# Patient Record
Sex: Female | Born: 1945 | Hispanic: Yes | Smoking: Never smoker
Health system: Southern US, Community
[De-identification: ages and names within clinical notes are randomized; demographics above are authoritative.]

## PROBLEM LIST (undated history)

## (undated) DIAGNOSIS — C801 Malignant (primary) neoplasm, unspecified: Secondary | ICD-10-CM

## (undated) DIAGNOSIS — E785 Hyperlipidemia, unspecified: Secondary | ICD-10-CM

## (undated) DIAGNOSIS — I10 Essential (primary) hypertension: Secondary | ICD-10-CM

## (undated) DIAGNOSIS — E119 Type 2 diabetes mellitus without complications: Secondary | ICD-10-CM

## (undated) HISTORY — PX: TONSILLECTOMY: SUR1361

## (undated) HISTORY — PX: BREAST SURGERY: SHX581

---

## 2017-05-06 ENCOUNTER — Encounter (HOSPITAL_BASED_OUTPATIENT_CLINIC_OR_DEPARTMENT_OTHER): Payer: Self-pay | Admitting: *Deleted

## 2017-05-06 ENCOUNTER — Emergency Department (HOSPITAL_BASED_OUTPATIENT_CLINIC_OR_DEPARTMENT_OTHER): Payer: Medicare HMO

## 2017-05-06 ENCOUNTER — Emergency Department (HOSPITAL_BASED_OUTPATIENT_CLINIC_OR_DEPARTMENT_OTHER)
Admission: EM | Admit: 2017-05-06 | Discharge: 2017-05-06 | Disposition: A | Discharge status: Home / Self Care | Payer: Medicare HMO | Attending: Emergency Medicine | Admitting: Emergency Medicine

## 2017-05-06 DIAGNOSIS — Z7984 Long term (current) use of oral hypoglycemic drugs: Secondary | ICD-10-CM | POA: Insufficient documentation

## 2017-05-06 DIAGNOSIS — N189 Chronic kidney disease, unspecified: Secondary | ICD-10-CM | POA: Insufficient documentation

## 2017-05-06 DIAGNOSIS — I129 Hypertensive chronic kidney disease with stage 1 through stage 4 chronic kidney disease, or unspecified chronic kidney disease: Secondary | ICD-10-CM | POA: Diagnosis not present

## 2017-05-06 DIAGNOSIS — Z7982 Long term (current) use of aspirin: Secondary | ICD-10-CM | POA: Insufficient documentation

## 2017-05-06 DIAGNOSIS — E1122 Type 2 diabetes mellitus with diabetic chronic kidney disease: Secondary | ICD-10-CM | POA: Insufficient documentation

## 2017-05-06 DIAGNOSIS — Z7902 Long term (current) use of antithrombotics/antiplatelets: Secondary | ICD-10-CM | POA: Insufficient documentation

## 2017-05-06 DIAGNOSIS — Z79899 Other long term (current) drug therapy: Secondary | ICD-10-CM | POA: Insufficient documentation

## 2017-05-06 DIAGNOSIS — N281 Cyst of kidney, acquired: Secondary | ICD-10-CM | POA: Diagnosis not present

## 2017-05-06 DIAGNOSIS — M545 Low back pain, unspecified: Secondary | ICD-10-CM

## 2017-05-06 DIAGNOSIS — M549 Dorsalgia, unspecified: Secondary | ICD-10-CM

## 2017-05-06 HISTORY — DX: Essential (primary) hypertension: I10

## 2017-05-06 HISTORY — DX: Hyperlipidemia, unspecified: E78.5

## 2017-05-06 HISTORY — DX: Type 2 diabetes mellitus without complications: E11.9

## 2017-05-06 LAB — URINALYSIS, ROUTINE W REFLEX MICROSCOPIC
Bilirubin Urine: NEGATIVE
Glucose, UA: NEGATIVE mg/dL
Hgb urine dipstick: NEGATIVE
Ketones, ur: NEGATIVE mg/dL
Leukocytes, UA: NEGATIVE
Nitrite: NEGATIVE
Protein, ur: NEGATIVE mg/dL
Specific Gravity, Urine: 1.003 — ABNORMAL LOW (ref 1.005–1.030)
pH: 7 (ref 5.0–8.0)

## 2017-05-06 LAB — COMPREHENSIVE METABOLIC PANEL
ALT: 11 U/L — ABNORMAL LOW (ref 14–54)
AST: 16 U/L (ref 15–41)
Albumin: 4.6 g/dL (ref 3.5–5.0)
Alkaline Phosphatase: 61 U/L (ref 38–126)
Anion gap: 11 (ref 5–15)
BUN: 25 mg/dL — ABNORMAL HIGH (ref 6–20)
CO2: 25 mmol/L (ref 22–32)
Calcium: 10.6 mg/dL — ABNORMAL HIGH (ref 8.9–10.3)
Chloride: 100 mmol/L — ABNORMAL LOW (ref 101–111)
Creatinine, Ser: 1.16 mg/dL — ABNORMAL HIGH (ref 0.44–1.00)
GFR calc Af Amer: 53 mL/min — ABNORMAL LOW (ref >60)
GFR calc non Af Amer: 46 mL/min — ABNORMAL LOW (ref >60)
Glucose, Bld: 127 mg/dL — ABNORMAL HIGH (ref 65–99)
Potassium: 3.3 mmol/L — ABNORMAL LOW (ref 3.5–5.1)
Sodium: 136 mmol/L (ref 135–145)
Total Bilirubin: 0.8 mg/dL (ref 0.3–1.2)
Total Protein: 8 g/dL (ref 6.5–8.1)

## 2017-05-06 LAB — CBC WITH DIFFERENTIAL/PLATELET
Basophils Absolute: 0 10*3/uL (ref 0.0–0.1)
Basophils Relative: 0 %
Eosinophils Absolute: 0.2 10*3/uL (ref 0.0–0.7)
Eosinophils Relative: 2 %
HCT: 36.2 % (ref 36.0–46.0)
Hemoglobin: 12.7 g/dL (ref 12.0–15.0)
Lymphocytes Relative: 17 %
Lymphs Abs: 1.7 10*3/uL (ref 0.7–4.0)
MCH: 31.8 pg (ref 26.0–34.0)
MCHC: 35.1 g/dL (ref 30.0–36.0)
MCV: 90.7 fL (ref 78.0–100.0)
Monocytes Absolute: 0.8 10*3/uL (ref 0.1–1.0)
Monocytes Relative: 7 %
Neutro Abs: 7.6 10*3/uL (ref 1.7–7.7)
Neutrophils Relative %: 74 %
Platelets: 227 10*3/uL (ref 150–400)
RBC: 3.99 MIL/uL (ref 3.87–5.11)
RDW: 12.3 % (ref 11.5–15.5)
WBC: 10.3 10*3/uL (ref 4.0–10.5)

## 2017-05-06 LAB — TROPONIN I: Troponin I: <0.03 ng/mL (ref <0.03)

## 2017-05-06 MED ORDER — HYDROCODONE-ACETAMINOPHEN 5-325 MG PO TABS
1.0000 | ORAL_TABLET | Freq: Once | ORAL | Status: AC
  Administered 2017-05-06: 1 via ORAL
  Filled 2017-05-06: qty 1

## 2017-05-06 MED ORDER — HYDROCODONE-ACETAMINOPHEN 5-325 MG PO TABS: 1.0000 | ORAL_TABLET | Freq: Four times a day (QID) | ORAL | 0 refills | Status: DC | PRN

## 2017-05-06 NOTE — ED Notes (Signed)
EDP at bedside  

## 2017-05-06 NOTE — ED Triage Notes (Signed)
Pt c/o hypertension x 2 days

## 2017-05-06 NOTE — Discharge Instructions (Signed)
You had a CT scan showed cysts on your kidneys as well as a small cyst on your right ovary.  These will need further evaluation by your doctor.  Get rechecked immediately if you develop new or worrisome symptoms.

## 2017-05-06 NOTE — ED Provider Notes (Signed)
[facility] Provider Note   CSN: [id number] Arrival date & time: 05/06/17  1203     History   Chief Complaint Chief Complaint  Patient presents with  . Hypertension    HPI Veronica Jimenez is a 71 y.o. female.  The history is provided by the patient. No language interpreter was used.    Veronica Jimenez is a 71 y.o. female who presents to the Emergency Department complaining of back pain. She reports 2 weeks of gradual onset and progressive back pain. It is in her mid lumbar region and radiates to both sides into the top of both hips. Her pain is slightly greater on the right compared to the left. She denies any fevers, abdominal pain, nausea, vomiting, diarrhea, dysuria, numbness, weakness. She does endorse some mild abdominal bloating. She has a history of CK D and hypertension. She notes that her blood pressure fluctuates significantly. Her blood pressure goes higher when she has significant pain. No recent injuries.   Labs from [place] on 05/03/17 Cr 1.6; BUN27; Na 136 k+4.5; Cl 97, co2 20, Ca 10.9, AST 14, ALT 9. Past Medical History:  Diagnosis Date  . Diabetes mellitus without complication ([place])   . Hyperlipemia   . Hypertension     There are no active problems to display for this patient.   Past Surgical History:  Procedure Laterality Date  . TONSILLECTOMY      OB History    No data available       Home Medications    Prior to Admission medications   Medication Sig Start Date End Date Taking? Authorizing Provider  amLODipine-benazepril (LOTREL) 10-40 MG capsule Take 1 capsule by mouth daily.   Yes [provider]  aspirin EC 81 MG tablet Take 81 mg by mouth daily.   Yes [provider]  cloNIDine (CATAPRES) 0.1 MG tablet Take 0.1 mg by mouth 2 (two) times daily.   Yes [provider]  hydrochlorothiazide (HYDRODIURIL) 25 MG tablet Take 25 mg by mouth daily.   Yes [provider]  metFORMIN (GLUCOPHAGE)  1000 MG tablet Take 500 mg by mouth 2 (two) times daily with a meal.   Yes [provider]  pravastatin (PRAVACHOL) 80 MG tablet Take 80 mg by mouth daily.   Yes [provider]  sotalol (BETAPACE) 80 MG tablet Take 80 mg by mouth 2 (two) times daily.   Yes [provider]  traMADol (ULTRAM) 50 MG tablet Take by mouth every 6 (six) hours as needed.   Yes [provider]  HYDROcodone-acetaminophen (NORCO/VICODIN) 5-325 MG tablet Take 1 tablet by mouth every 6 (six) hours as needed. 05/06/17   Quintella Reichert, MD    Family History No family history on file.  Social History Social History  Substance Use Topics  . Smoking status: Never Smoker  . Smokeless tobacco: Not on file  . Alcohol use No     Allergies   Shellfish allergy   Review of Systems Review of Systems  All other systems reviewed and are negative.    Physical Exam Updated Vital Signs BP 134/66 (BP Location: Right Arm)   Pulse 60   Temp 98.2 F (36.8 C) (Oral)   Resp 18   Ht 5\' 5"  (1.651 m)   Wt 66.2 kg (146 lb)   SpO2 94%   BMI 24.30 kg/m   Physical Exam  Constitutional: She is oriented to person, place, and time. She appears well-developed and well-nourished.  HENT:  Head: Normocephalic and  atraumatic.  Cardiovascular: Normal rate and regular rhythm.   No murmur heard. Pulmonary/Chest: Effort normal and breath sounds normal. No respiratory distress.  Abdominal: Soft. There is no rebound and no guarding.  Mild lower abdominal tenderness  Musculoskeletal: She exhibits no edema or tenderness.  2+ DP pulses bilaterally  Neurological: She is alert and oriented to person, place, and time.  5/5 strength in all four extremities with sensation to light touch intact in all four extremities.   Skin: Skin is warm and dry.  Psychiatric: She has a normal mood and affect. Her behavior is normal.  Nursing note and vitals reviewed.    ED Treatments / Results  Labs (all labs  ordered are listed, but only abnormal results are displayed) Labs Reviewed  COMPREHENSIVE METABOLIC PANEL - Abnormal; Notable for the following:       Result Value   Potassium 3.3 (*)    Chloride 100 (*)    Glucose, Bld 127 (*)    BUN 25 (*)    Creatinine, Ser 1.16 (*)    Calcium 10.6 (*)    ALT 11 (*)    GFR calc non Af Amer 46 (*)    GFR calc Af Amer 53 (*)    All other components within normal limits  URINALYSIS, ROUTINE W REFLEX MICROSCOPIC - Abnormal; Notable for the following:    Specific Gravity, Urine 1.003 (*)    All other components within normal limits  TROPONIN I  CBC WITH DIFFERENTIAL/PLATELET    EKG  EKG Interpretation  Date/Time:  Saturday May 06 2017 14:26:10 EDT Ventricular Rate:  68 PR Interval:    QRS Duration: 94 QT Interval:  443 QTC Calculation: 472 R Axis:   70 Text Interpretation:  Sinus rhythm Multiple premature complexes, vent & supraven Baseline wander in lead(s) I III aVL V2 V3 V5 no prior available for comparison Confirmed by Hazle Coca [phone]) on 05/06/2017 2:32:54 PM       Radiology Ct Renal Stone Study  Result Date: 05/06/2017 CLINICAL DATA:  Left-sided abdominal pain for 2 weeks. EXAM: CT ABDOMEN AND PELVIS WITHOUT CONTRAST TECHNIQUE: Multidetector CT imaging of the abdomen and pelvis was performed following the standard protocol without IV contrast. COMPARISON:  None. FINDINGS: Lower chest: Bronchiectasis and partially visualized peribronchial thickening in the right middle lobe and lingula. Scattered tree-in-bud opacities with subpleural predominance. Hepatobiliary: No focal liver abnormality is seen. No gallstones, gallbladder wall thickening, or biliary dilatation. Pancreas: Unremarkable. No pancreatic ductal dilatation or surrounding inflammatory changes. Spleen: No splenic injury or perisplenic hematoma. Adrenals/Urinary Tract: Adrenal glands are normal. Bilateral tiny nonobstructive calculi, the largest of which measures 2 mm in the  midpole of the right kidney. No evidence of hydronephrosis. 4.4 cm simple appearing lower pole right renal cyst. 0.5 cm left superior pole renal cyst. Incompletely characterized due to lack of IV contrast hypoattenuated areas in the medial mid cortex of the left kidney, and bilateral upper poles. Stomach/Bowel: Stomach is within normal limits. No evidence of bowel wall thickening, distention, or inflammatory changes. Vascular/Lymphatic: Aortic atherosclerosis. No enlarged abdominal or pelvic lymph nodes. Reproductive: Uterus and left adnexa are unremarkable. Somewhat thickened appearance of the right ovary containing 1.4 cm rim calcified cystic structure. Other: No abdominal wall hernia or abnormality. No abdominopelvic ascites. Musculoskeletal: Multilevel osteoarthritic changes. IMPRESSION: No evidence of obstructive uropathy. Bilateral nephrolithiasis. Bilateral renal cysts. Additional areas of hypoattenuation within the cortex of bilateral kidneys, incompletely evaluated due to lack of IV contrast. Further evaluation with MRI or CT,  renal protocol, may be considered, if clinically desired. Somewhat prominent appearance of the right ovary, which contains rim calcified 1.4 cm cystic structure. Further evaluation with MRI or ultrasound of the pelvis may be considered, if found clinically important. Calcific atherosclerotic disease of the aorta without evidence of aneurysmal dilation. Electronically Signed   By: Fidela Salisbury M.D.   On: 05/06/2017 15:30    Procedures Procedures (including critical care time)  Medications Ordered in ED Medications  HYDROcodone-acetaminophen (NORCO/VICODIN) 5-325 MG per tablet 1 tablet (1 tablet Oral Given 05/06/17 1444)     Initial Impression / Assessment and Plan / ED Course  I have reviewed the triage vital signs and the nursing notes.  Pertinent labs & imaging results that were available during my care of the patient were reviewed by me and considered in my  medical decision making (see chart for details).     Patient here for evaluation of progressive low back pain. She has no radicular symptoms and she is neurovascularly intact on examination. Imaging is negative for obstructive stone or AAA. She does incidentally have renal cysts and a small ovarian cyst. Discussed with patient importance of PCP follow-up for further imaging to further evaluate axis. No evidence of acute UTI. Her renal function is at her baseline. Counseled patient on home care for low back pain, outpatient follow-up and return precautions.  Final Clinical Impressions(s) / ED Diagnoses   Final diagnoses:  Acute bilateral low back pain without sciatica  Renal cyst    New Prescriptions Discharge Medication List as of 05/06/2017  4:15 PM    START taking these medications   Details  HYDROcodone-acetaminophen (NORCO/VICODIN) 5-325 MG tablet Take 1 tablet by mouth every 6 (six) hours as needed., Starting Sat 05/06/2017, Print         Quintella Reichert, MD 05/07/17 0710

## 2017-05-06 NOTE — ED Notes (Signed)
Patient reports last eating yesterday, she states that she noticed she was bloated after eating, she would take omeprazole which has helped in the past, but the past couple days it is not helping. Patient states she has not eaten she feels a little better, denies hunger at this time.

## 2017-05-06 NOTE — ED Notes (Signed)
Patient c/o left abd pain x2 weeks. Patient was seen by PCP this week for blood work related to BP, also had an echo done on her heart, was told this was normal. Patient was told her kidneys were showing degenerative changes. A&Ox4.

## 2019-03-16 ENCOUNTER — Emergency Department (HOSPITAL_COMMUNITY): Payer: Medicare HMO

## 2019-03-16 ENCOUNTER — Encounter (HOSPITAL_COMMUNITY): Payer: Self-pay | Admitting: Emergency Medicine

## 2019-03-16 ENCOUNTER — Emergency Department (HOSPITAL_COMMUNITY)
Admission: EM | Admit: 2019-03-16 | Discharge: 2019-03-16 | Disposition: A | Discharge status: Home / Self Care | Payer: Medicare HMO | Attending: Emergency Medicine | Admitting: Emergency Medicine

## 2019-03-16 ENCOUNTER — Other Ambulatory Visit: Payer: Self-pay

## 2019-03-16 DIAGNOSIS — R079 Chest pain, unspecified: Secondary | ICD-10-CM

## 2019-03-16 DIAGNOSIS — E119 Type 2 diabetes mellitus without complications: Secondary | ICD-10-CM | POA: Diagnosis not present

## 2019-03-16 DIAGNOSIS — I10 Essential (primary) hypertension: Secondary | ICD-10-CM | POA: Diagnosis not present

## 2019-03-16 DIAGNOSIS — R0789 Other chest pain: Secondary | ICD-10-CM | POA: Insufficient documentation

## 2019-03-16 DIAGNOSIS — Z79899 Other long term (current) drug therapy: Secondary | ICD-10-CM | POA: Insufficient documentation

## 2019-03-16 LAB — BASIC METABOLIC PANEL
Anion gap: 11 (ref 5–15)
BUN: 38 mg/dL — ABNORMAL HIGH (ref 8–23)
CO2: 21 mmol/L — ABNORMAL LOW (ref 22–32)
Calcium: 10.3 mg/dL (ref 8.9–10.3)
Chloride: 106 mmol/L (ref 98–111)
Creatinine, Ser: 1.59 mg/dL — ABNORMAL HIGH (ref 0.44–1.00)
GFR calc Af Amer: 37 mL/min — ABNORMAL LOW (ref >60)
GFR calc non Af Amer: 32 mL/min — ABNORMAL LOW (ref >60)
Glucose, Bld: 134 mg/dL — ABNORMAL HIGH (ref 70–99)
Potassium: 3.8 mmol/L (ref 3.5–5.1)
Sodium: 138 mmol/L (ref 135–145)

## 2019-03-16 LAB — CBC
HCT: 36.5 % (ref 36.0–46.0)
Hemoglobin: 12.1 g/dL (ref 12.0–15.0)
MCH: 31 pg (ref 26.0–34.0)
MCHC: 33.2 g/dL (ref 30.0–36.0)
MCV: 93.6 fL (ref 80.0–100.0)
Platelets: 203 10*3/uL (ref 150–400)
RBC: 3.9 MIL/uL (ref 3.87–5.11)
RDW: 13.4 % (ref 11.5–15.5)
WBC: 11.3 10*3/uL — ABNORMAL HIGH (ref 4.0–10.5)
nRBC: 0 % (ref 0.0–0.2)

## 2019-03-16 LAB — TROPONIN I
Troponin I: <0.03 ng/mL (ref <0.03)
Troponin I: <0.03 ng/mL (ref <0.03)

## 2019-03-16 MED ORDER — SODIUM CHLORIDE 0.9% FLUSH
3.0000 mL | Freq: Once | INTRAVENOUS | Status: AC
  Administered 2019-03-16: 3 mL via INTRAVENOUS

## 2019-03-16 NOTE — ED Notes (Signed)
Patient transported to X-ray 

## 2019-03-16 NOTE — ED Triage Notes (Signed)
Pt BIB EMS from home, c/o central CP that radiates to central back. Denies SOB, N/V/D, lightheadedness. No hx of similar. Given 324mg  ASA by EMS.

## 2019-03-16 NOTE — ED Provider Notes (Signed)
[facility] EMERGENCY DEPARTMENT Provider Note   CSN: [id number] Arrival date & time: 03/16/19  0128    History   Chief Complaint Chief Complaint  Patient presents with  . Chest Pain    HPI Veronica Jimenez is a 73 y.o. female.  HPI: A 73 year old patient with a history of treated diabetes, hypertension and hypercholesterolemia presents for evaluation of chest pain. Initial onset of pain was less than one hour ago. The patient's chest pain is described as heaviness/pressure/tightness and is not worse with exertion. The patient's chest pain is not middle- or left-sided, is not well-localized, is not sharp and does not radiate to the arms/jaw/neck. The patient does not complain of nausea and denies diaphoresis. The patient has no history of stroke, has no history of peripheral artery disease, has not smoked in the past 90 days, has no relevant family history of coronary artery disease (first degree relative at less than age 12) and does not have an elevated BMI (>=30).   Patient comes to the ER for evaluation of chest pain.  She started to feel discomfort in her chest around 10:30 PM.  She took her blood pressure and it was markedly elevated, 190/140.  She reports that her blood pressure has been running high the last few days.  She has clonidine that she uses as needed elevated blood pressure, she took one tonight and called EMS.  Pain lasted about an hour and has now resolved.  No associated shortness of breath.  No history of heart disease but does have multiple cardiac risk factors.  Patient reports that she went to the doctor earlier today because her blood pressure has been running high and had a new blood pressure medication prescribed, but has not started it yet.  She was supposed to start it tomorrow.     Past Medical History:  Diagnosis Date  . Diabetes mellitus without complication ([place])   . Hyperlipemia   . Hypertension     There are no active problems to  display for this patient.   Past Surgical History:  Procedure Laterality Date  . TONSILLECTOMY       OB History   No obstetric history on file.      Home Medications    Prior to Admission medications   Medication Sig Start Date End Date Taking? Authorizing Provider  albuterol (VENTOLIN HFA) 108 (90 Base) MCG/ACT inhaler Inhale 1-2 puffs into the lungs every 6 (six) hours as needed for wheezing or shortness of breath.   Yes [provider]  amLODipine-benazepril (LOTREL) 10-40 MG capsule Take 1 capsule by mouth daily.   Yes [provider]  aspirin EC 81 MG tablet Take 81 mg by mouth daily.   Yes [provider]  cloNIDine (CATAPRES) 0.1 MG tablet Take 0.1 mg by mouth 2 (two) times daily as needed (high blood pressure).    Yes [provider]  linagliptin (TRADJENTA) 5 MG TABS tablet Take 5 mg by mouth daily. 01/12/18  Yes [provider]  montelukast (SINGULAIR) 10 MG tablet Take 10 mg by mouth at bedtime.   Yes [provider]  pravastatin (PRAVACHOL) 80 MG tablet Take 80 mg by mouth daily.   Yes [provider]  sotalol (BETAPACE) 80 MG tablet Take 80 mg by mouth 2 (two) times daily.   Yes [provider]  HYDROcodone-acetaminophen (NORCO/VICODIN) 5-325 MG tablet Take 1 tablet by mouth every 6 (six) hours as needed. Patient not taking: Reported on 03/16/2019 05/06/17  Quintella Reichert, MD    Family History History reviewed. No pertinent family history.  Social History Social History   Tobacco Use  . Smoking status: Never Smoker  . Smokeless tobacco: Never Used  Substance Use Topics  . Alcohol use: No  . Drug use: No     Allergies   Shellfish allergy   Review of Systems Review of Systems  Cardiovascular: Positive for chest pain.  All other systems reviewed and are negative.    Physical Exam Updated Vital Signs BP (!) 145/86   Pulse 62   Temp 98.2 F (36.8 C) (Oral)   Resp 20   Ht 5\' 4"   (1.626 m)   Wt 63.5 kg   SpO2 94%   BMI 24.03 kg/m   Physical Exam Vitals signs and nursing note reviewed.  Constitutional:      General: She is not in acute distress.    Appearance: Normal appearance. She is well-developed.  HENT:     Head: Normocephalic and atraumatic.     Right Ear: Hearing normal.     Left Ear: Hearing normal.     Nose: Nose normal.  Eyes:     Conjunctiva/sclera: Conjunctivae normal.     Pupils: Pupils are equal, round, and reactive to light.  Neck:     Musculoskeletal: Normal range of motion and neck supple.  Cardiovascular:     Rate and Rhythm: Regular rhythm.     Heart sounds: S1 normal and S2 normal. No murmur. No friction rub. No gallop.   Pulmonary:     Effort: Pulmonary effort is normal. No respiratory distress.     Breath sounds: Normal breath sounds.  Chest:     Chest wall: No tenderness.  Abdominal:     General: Bowel sounds are normal.     Palpations: Abdomen is soft.     Tenderness: There is no abdominal tenderness. There is no guarding or rebound. Negative signs include Murphy's sign and McBurney's sign.     Hernia: No hernia is present.  Musculoskeletal: Normal range of motion.  Skin:    General: Skin is warm and dry.     Findings: No rash.  Neurological:     Mental Status: She is alert and oriented to person, place, and time.     GCS: GCS eye subscore is 4. GCS verbal subscore is 5. GCS motor subscore is 6.     Cranial Nerves: No cranial nerve deficit.     Sensory: No sensory deficit.     Coordination: Coordination normal.  Psychiatric:        Speech: Speech normal.        Behavior: Behavior normal.        Thought Content: Thought content normal.      ED Treatments / Results  Labs (all labs ordered are listed, but only abnormal results are displayed) Labs Reviewed  BASIC METABOLIC PANEL - Abnormal; Notable for the following components:      Result Value   CO2 21 (*)    Glucose, Bld 134 (*)    BUN 38 (*)    Creatinine,  Ser 1.59 (*)    GFR calc non Af Amer 32 (*)    GFR calc Af Amer 37 (*)    All other components within normal limits  CBC - Abnormal; Notable for the following components:   WBC 11.3 (*)    All other components within normal limits  TROPONIN I  TROPONIN I    EKG EKG Interpretation  Date/Time:  Saturday March 16 2019 01:35:22 EDT Ventricular Rate:  72 PR Interval:    QRS Duration: 83 QT Interval:  422 QTC Calculation: 462 R Axis:   42 Text Interpretation:  Sinus rhythm Borderline T abnormalities, anterior leads No significant change since last tracing Confirmed by Orpah Greek [phone]) on 03/16/2019 2:05:25 AM   Radiology Dg Chest 2 View  Result Date: 03/16/2019 CLINICAL DATA:  Chest pain EXAM: CHEST - 2 VIEW COMPARISON:  None. FINDINGS: The heart size and mediastinal contours are within normal limits. Both lungs are clear. The visualized skeletal structures are unremarkable. IMPRESSION: No active cardiopulmonary disease. Electronically Signed   By: Ulyses Jarred M.D.   On: 03/16/2019 02:07    Procedures Procedures (including critical care time)  Medications Ordered in ED Medications  sodium chloride flush (NS) 0.9 % injection 3 mL (3 mLs Intravenous Given 03/16/19 0145)     Initial Impression / Assessment and Plan / ED Course  I have reviewed the triage vital signs and the nursing notes.  Pertinent labs & imaging results that were available during my care of the patient were reviewed by me and considered in my medical decision making (see chart for details).     HEAR Score: 5  Presents to the emergency department for evaluation of chest pain.  Patient had onset of pain approximately an hour before coming to the ER.  She took her blood pressure and it was very elevated.  She has noticed that she has been experiencing uncontrolled blood pressure over the last few days and actually saw her primary care doctor earlier today.  He prescribed a new blood pressure  medicine which she has not started yet.  She does have clonidine prescribed that she uses for blood pressure elevation as needed.  She took 1 prior to coming to the ER and her blood pressure is much improved and her pain has resolved.  EKG does not suggest ischemia or infarct.  Opponent was negative.  Patient has a heart score of 5.  I discussed this with her and recommended admission.  Additionally, however, we did discuss the risks of admission at this time during coronavirus pandemic.  She does not wish to be admitted at this time, understands the risks of missing cardiac chest pain and the possibility of MI.  I do, however, feel that she is likely experiencing noncardiac chest pain and there is a significant risk for her with her comorbidities if she were to get sick with coronavirus.  Based on this, she therefore was held in the ER for a second troponin.  This was also undetectable.  She will be discharged, referred to cardiology.  Return for any recurrent symptoms.  She will start her new blood pressure medication today, use clonidine as needed and return for uncontrolled blood pressure.  Final Clinical Impressions(s) / ED Diagnoses   Final diagnoses:  Chest pain, unspecified type  Essential hypertension    ED Discharge Orders    None       Orpah Greek, MD 03/16/19 [phone]

## 2019-09-24 ENCOUNTER — Ambulatory Visit (INDEPENDENT_AMBULATORY_CARE_PROVIDER_SITE_OTHER): Payer: Self-pay | Admitting: Licensed Clinical Social Worker

## 2019-09-24 ENCOUNTER — Other Ambulatory Visit: Payer: Self-pay

## 2019-09-24 DIAGNOSIS — F489 Nonpsychotic mental disorder, unspecified: Secondary | ICD-10-CM

## 2019-09-24 NOTE — Progress Notes (Signed)
No show

## 2019-10-28 ENCOUNTER — Ambulatory Visit (INDEPENDENT_AMBULATORY_CARE_PROVIDER_SITE_OTHER): Payer: Commercial Indemnity | Admitting: Licensed Clinical Social Worker

## 2019-10-28 DIAGNOSIS — F4323 Adjustment disorder with mixed anxiety and depressed mood: Secondary | ICD-10-CM | POA: Diagnosis not present
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
## 2019-12-09 ENCOUNTER — Ambulatory Visit (HOSPITAL_COMMUNITY): Payer: Commercial Indemnity | Admitting: Licensed Clinical Social Worker

## 2019-12-09 ENCOUNTER — Other Ambulatory Visit: Payer: Self-pay

## 2019-12-09 DIAGNOSIS — F4323 Adjustment disorder with mixed anxiety and depressed mood: Secondary | ICD-10-CM

## 2019-12-09 NOTE — Progress Notes (Signed)
THERAPIST PROGRESS NOTE  Session Time: 11:02 AM to 11:57 AM  Participation Level: Active  Behavioral Response: CasualAlertDepressed and tearful  Type of Therapy: Individual Therapy  Treatment Goals addressed:  decrease depression and anxiety, address conflicts in her relationship with children  Interventions: Solution Focused, Strength-based, Supportive and Other: coping  Summary: Arrow Emmerich is a 74 y.o. female who presents with little depressed once in awhile but feeling better. Daughter just had her baby and won't be able to see baby for a few months. Discussed whether daughter's reaction to not letting patient see the baby was extreme reaction to the pandemic. Patient shares she understands as in the past she had miscarriages and only until daughter was 47 did she start to have children. Understands her reason for being careful. At son's and just like her daughter son can be insensitive and say something mean that can hurt her feeling. Patient holds back so doesn't make it worse, so keeps quiet. It hurts. Shared she carried the load for the whole family for all those years with a husband that was sick and taken care of young children. "I worked hard for them did everything I could. Still try to help them. I don't know maybe the pandemic brings the worst out on people." Shares that daughter's comment was that patient was difficult. Patient shares her experience of living with daughter, having to do things to work around them. Daughter brings up past of patient was difficult. She brings up for example patient taking her time to get rid of things in her house, patient describes the challenge of getting rid so much stuff that was brought with her that had accumulated over the years."It has been difficult." Daughter makes faces when talking and all that got to patient. Patient describes the experience of always living in her room. Wants to help her out, daughter doesn't let her help out.  Reviewed what patient wants and she explains that she would like to be able to spend time with grandchildren both her daughter and son without being told what to do. "They make me feel like I am stupid that I don't know what I am doing, that I am not being careful".reviewed session and what was most helpful patient said she is able to talk and does not have that.   Suicidal/Homicidal: No  Therapist Response: Therapist reviewed symptoms, discussed stressors, facilitated expression of thoughts and feelings.  Reviewed communication strategies to help patient's kids understand insensitive words and how they impact her.  Explored their motivations and intentions in the relationship to help patient with coping.  For example, daughter stressor related to having miscarriages before explain stress may explain why she may be harsher than normal.  Exploring underlying reason for both of her kids being and insensitive relates also to the personality.  Reviewed patient losing independence and giving up her own place puts her in a position of dependence and not having as much control she did when she had her own place.  Reviewed ways patient can carve out more independence for herself.  Discussed strategies of communication where patient and daughter can come to agreement and realizing her participating more will be beneficial for both daughter and patient.  Also working on increasing insight of children in general in the relationship to their parents can involve lack of appreciation for what they have done, lack of recognizing wisdom parents have from experiencing a lot of life and this related to children's lack of insight, lack of growth  and self-awareness to recognize value of role of parent to the full extent.  Assess patient's expressions of feelings therapeutic for her as therapy provides an outlet that she does not have other places to express feelings.  Provided strength based on supportive intervention  Plan:  Return again in 2 weeks.2.  Work with patient on conflicts in her relationships with children, decrease depression, coping  Diagnosis: Axis I:  adjustment disorder with mixed anxiety and depression    Axis II: No diagnosis    Cordella Register, LCSW 12/09/2019

## 2019-12-23 ENCOUNTER — Ambulatory Visit (HOSPITAL_COMMUNITY): Payer: Commercial Indemnity | Admitting: Licensed Clinical Social Worker

## 2019-12-23 DIAGNOSIS — F4323 Adjustment disorder with mixed anxiety and depressed mood: Secondary | ICD-10-CM
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
## 2020-01-06 ENCOUNTER — Ambulatory Visit (INDEPENDENT_AMBULATORY_CARE_PROVIDER_SITE_OTHER): Payer: Medicare HMO | Admitting: Licensed Clinical Social Worker

## 2020-01-06 DIAGNOSIS — F4323 Adjustment disorder with mixed anxiety and depressed mood: Secondary | ICD-10-CM

## 2020-01-06 NOTE — Progress Notes (Signed)
THERAPIST PROGRESS NOTE  Session Time: 11:02 AM to 11:55 AM  Participation Level: Active  Behavioral Response: CasualAlertEuthymic and Irritable  Type of Therapy: Individual Therapy  Treatment Goals addressed: work with stress of COVID, address conflicts in her relationship with children, mood enhancment  Interventions: Solution Focused, Strength-based, Supportive and Other: coping, stress management, work on conflict in her relationships  Summary: Veronica Jimenez is a 74 y.o. female who presents with discussed how things have been in her relationship with daughter.  She was able to see young grandchild 58 who is newborn and Veronica Jimenez who she had special attachment with. (Daughters' kids) reviewed little things will annoy her about her daughter for example her excessive concern about her son and feeling she is more at risk when she goes to their house.  Things that do not make sense to patient when patient is careful and son has minimal exposure.  Explored what patient feels will happen in her relationship now with patient living at son's moving out of daughters and she is not sure.  Does feel Covid has significant impact and makes things more stressful and once that is better hopes things will improve.  While at her son's, he has been great and made things easier for patient.  She sees ways he has been thoughtful, therapist pointed out positive development as she is gotten closer with his kids by staying there.  Reviewed any changes she thinks daughter could make, since daughter is gone to therapy she feels she has to let out her feelings at least when patient was living there and therapist and patient agreed they have to respect people's feelings and say things in a way that won't hurt somebody.  Now that they are not in the same house patient not sure it is the same situation.  Will feel bad if things change from what they are now as she is getting closer with youngest child of son.   Focused on central issues to conflict that daughter wants to control everything has made it worse with Covid and patient shares she is used to making her own decisions.  Daughter and son-in-law in managing decisions she feels want to put too much control on situations and at the same time not recognizing patient is taking precautions, not needing the excessive control and how she is managing things.  Suicidal/Homicidal: No  Therapist Response: Therapist reviewed symptoms, discussed stressors, facilitated expression of thoughts and feelings, completed treatment plan.  Continue to work with patient on gaining insight to relationship with daughter that will help work through conflicts and help to improve relationship.  Identify Covid as significant stressor that has put strain on relationship and when the situation improves will help the relationship, also identify patient is used to making her own decisions that comes in conflict with how daughter wants to handle things, reviewed daughter trying to exercise too much control over situations that are necessary and restrictive for patient.  Reviewed attitudes that may help with management by reflecting daughter is who she is and having to accept that.  Reviewed positive developments, patient being able to see grandkids, strongest relationship she has with grandchild please that she will always have, living with her son who has been thoughtful toward her, seeing as things have developed some positive aspects that have come from it.  Therapist provided strength based and supportive intervention  Plan: Return again in 4 weeks.2.Work with patient on conflicts in her relationships with children, decrease depressio  Diagnosis: Axis I:  adjustment disorder with mixed anxiety and depression    Axis II: No diagnosis    Cordella Register, LCSW 01/06/2020

## 2020-02-03 ENCOUNTER — Ambulatory Visit (INDEPENDENT_AMBULATORY_CARE_PROVIDER_SITE_OTHER): Payer: Medicare HMO | Admitting: Licensed Clinical Social Worker

## 2020-02-03 DIAGNOSIS — F4323 Adjustment disorder with mixed anxiety and depressed mood: Secondary | ICD-10-CM

## 2020-02-03 NOTE — Progress Notes (Signed)
THERAPIST PROGRESS NOTE  Session Time: 11:00 AM to 11:55 AM  Participation Level: Active  Behavioral Response: CasualAlertsubdued  Type of Therapy: Individual Therapy  Treatment Goals addressed:  work with stress of COVID, address conflicts in her relationship with children, mood enhancment  Interventions: Solution Focused, Strength-based, Supportive and Other: working on relationship issues, coping  Summary: Veronica Jimenez is a 74 y.o. female who presents update for therapist as to symptoms and significant events, reviewed patient's relationship with daughter and patient said "that at least she is talking to me." Talk at least once a week on the phone. Talking more than when she was pregnant. Explored with patient reasons she is reaching out more.  Her daughter see significant relationship patient has with granddaughter, [name], also she is young children realizing need of support from family as some of the possibilities.  Patient relates that it still hurts a little bit and trying to let it go.  Discussed realizing that in general in life, patient has realized holding onto things not good for health, to consuming. Shared how her husband has seen her do that with other conflicts in her life.  Patient shares "not dwell on it and do the best with what have." Realizes it starts to heal.  Reviewed kids are upset that she does not tell them things, she figured she is getting older and just going to happen.  For example she has had kidney problems and relates she is trying to do better about communicating.  Therapist guided patient in looking at conflict with daughter again bringing up kids can be selfish, can come because of the nature of the child parent relationship that kids do not see.  Patient shared more about her family and helping understand dynamics, older daughter has 3 grown boys, has been regretted not seeing them more, they have always live further away from family, patient is  always seen that it is part of getting married and starting a life with your husband.  Her son had kids older he has 63 kids oldest is 31 and youngest is 2, showed therapist pitcher of youngest daughter Veronica Jimenez and described her as independent.  Reviewed she is spending more time with them.  Also showed therapist pictures of baby Daine Jimenez and Veronica Jimenez who are youngest daughter's kids.  Patient brought up bringing daughter into session therapist that can be helpful and working on relationships, brought up issue that daughter thinks she was not concerned about baby and this being something to discuss in session.  Suicidal/Homicidal: No  Therapist Response: Therapist reviewed symptoms, facilitated expression of thoughts and feelings, says making progress on treatment goals of working on complex in relationship with daughter as patient explains feels less hurt and able to let go of little bit.  Work with patient on insight of coping strategy of letting go to manage difficulties in life helpful strategy, a person does not continue to carry distress and hurt and also often his situation can change so helps to free ourselves to move forward from emotional pain when situation has happened and need to move forward.  In helping patient work on relationship with daughter using insight oriented interventions such as recognizing kids in relationship with parents can remain selfish even as they get older to help understand why kids can be insensitive.  Discussed how role changes in relationships factor into conflict, daughter now taking on role of mother and wife now wants to do things her own way, while at the same time  patient as parent used to being in charge and that can bring about conflict.  Also relationship between parents and children may relate to why kids not always sensitive and giving with parents because of being habituated to the role as kids and even though kids get older and more mature there can be aspects of this  relationship pattern between parents and kids that remain.  Reviewed plan of bringing daughter in to session to help work through conflict, facilitate communication.  Provided strength based and supportive intervention  Plan: Return again in 2 weeks.2.  Patient may bring daughter into session to work on relationship 3.  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

## 2020-02-17 ENCOUNTER — Ambulatory Visit (HOSPITAL_COMMUNITY): Payer: Medicare HMO | Admitting: Licensed Clinical Social Worker

## 2020-03-16 ENCOUNTER — Ambulatory Visit (HOSPITAL_COMMUNITY): Payer: Medicare HMO | Admitting: Licensed Clinical Social Worker

## 2020-09-18 ENCOUNTER — Telehealth: Payer: Self-pay | Admitting: *Deleted

## 2020-09-18 NOTE — Telephone Encounter (Signed)
NOTES ON FILE FROM [place]. [place].

## 2020-10-05 ENCOUNTER — Encounter: Payer: Self-pay | Admitting: *Deleted

## 2020-10-05 ENCOUNTER — Encounter: Payer: Self-pay | Admitting: Cardiology

## 2020-10-05 ENCOUNTER — Other Ambulatory Visit: Payer: Self-pay

## 2020-10-05 ENCOUNTER — Ambulatory Visit: Payer: Medicare HMO | Admitting: Cardiology

## 2020-10-05 VITALS — BP 132/70 | HR 78 | Ht 63.5 in | Wt 142.4 lb

## 2020-10-05 DIAGNOSIS — I10 Essential (primary) hypertension: Secondary | ICD-10-CM

## 2020-10-05 DIAGNOSIS — I480 Paroxysmal atrial fibrillation: Secondary | ICD-10-CM | POA: Diagnosis not present

## 2020-10-05 DIAGNOSIS — E118 Type 2 diabetes mellitus with unspecified complications: Secondary | ICD-10-CM

## 2020-10-05 DIAGNOSIS — N184 Chronic kidney disease, stage 4 (severe): Secondary | ICD-10-CM | POA: Diagnosis not present

## 2020-10-05 MED ORDER — HYDRALAZINE HCL 25 MG PO TABS: ORAL_TABLET | ORAL | 3 refills | Status: DC

## 2020-10-05 NOTE — Progress Notes (Addendum)
Electrophysiology Office Note:    Date:  10/05/2020   ID:  Veronica Jimenez, DOB 10/03/1946, MRN [id number]  PCP:  Alma Friendly, MD  [facility] Cardiologist:  No primary care provider on file.  [facility] Electrophysiologist:  None   Referring MD: Alma Friendly, MD   Chief Complaint: Atrial fibrillation  History of Present Illness:    Veronica Jimenez is a 74 y.o. female who presents for an evaluation of atrial fibrillation at the request of Dr. Carlis Abbott. Their medical history includes diabetes, hypertension, hyperlipidemia chronic kidney disease.  She was seen on June 08, 2020 at [facility]  This visit was the Medicare routine health appointment.  According to that note, she had an echo done with a cardiologist in [place].  She tells me she has very labile blood pressures.  Systolic blood pressures are sometimes in the 190s.  Today they are in the 130s.  She has previously taken clonidine as needed to help control these elevated blood pressure recordings.  For her atrial fibrillation, she tells me she was first diagnosed in 2004.  She was started on sotalol at that time and has been on it uninterrupted until now.  She tells me she does not recall the last episode of atrial fibrillation other than it has been a very long time ago.  She is currently not on a blood thinner.  She tells me that her cardiologist thought that she was an elevated bleeding risk due to anemia of chronic kidney disease.  No severe bleeding episodes that she endorses during my interview with her today.  There is also concern given her poor renal function that the options for anticoagulant were limited.    Past Medical History:  Diagnosis Date  . Diabetes mellitus without complication ([place])   . Hyperlipemia   . Hypertension     Past Surgical History:  Procedure Laterality Date  . TONSILLECTOMY      Current Medications: Current Meds  Medication Sig  . albuterol (VENTOLIN HFA) 108  (90 Base) MCG/ACT inhaler Inhale 1-2 puffs into the lungs every 6 (six) hours as needed for wheezing or shortness of breath.  Marland Kitchen amLODipine-benazepril (LOTREL) 10-40 MG capsule Take 1 capsule by mouth daily.  Marland Kitchen aspirin EC 81 MG tablet Take 81 mg by mouth daily.  Marland Kitchen HYDROcodone-acetaminophen (NORCO/VICODIN) 5-325 MG tablet Take 1 tablet by mouth every 6 (six) hours as needed.  . linagliptin (TRADJENTA) 5 MG TABS tablet Take 5 mg by mouth daily.  . montelukast (SINGULAIR) 10 MG tablet Take 10 mg by mouth at bedtime.  . pravastatin (PRAVACHOL) 80 MG tablet Take 80 mg by mouth daily.  . [DISCONTINUED] cloNIDine (CATAPRES) 0.1 MG tablet Take 0.1 mg by mouth 2 (two) times daily as needed (high blood pressure).   . [DISCONTINUED] sotalol (BETAPACE) 80 MG tablet Take 80 mg by mouth 2 (two) times daily.     Allergies:   Shellfish allergy   Social History   Socioeconomic History  . Marital status: Widowed    Spouse name: Not on file  . Number of children: Not on file  . Years of education: Not on file  . Highest education level: Not on file  Occupational History  . Not on file  Tobacco Use  . Smoking status: Never Smoker  . Smokeless tobacco: Never Used  Vaping Use  . Vaping Use: Never used  Substance and Sexual Activity  . Alcohol use: No  . Drug use: No  . Sexual activity: Not Currently  Other Topics Concern  . Not on file  Social History Narrative  . Not on file   Social Determinants of Health   Financial Resource Strain:   . Difficulty of Paying Living Expenses: Not on file  Food Insecurity:   . Worried About Charity fundraiser in the Last Year: Not on file  . Ran Out of Food in the Last Year: Not on file  Transportation Needs:   . Lack of Transportation (Medical): Not on file  . Lack of Transportation (Non-Medical): Not on file  Physical Activity:   . Days of Exercise per Week: Not on file  . Minutes of Exercise per Session: Not on file  Stress:   . Feeling of Stress :  Not on file  Social Connections:   . Frequency of Communication with Friends and Family: Not on file  . Frequency of Social Gatherings with Friends and Family: Not on file  . Attends Religious Services: Not on file  . Active Member of Clubs or Organizations: Not on file  . Attends Archivist Meetings: Not on file  . Marital Status: Not on file     Family History: The patient's family history includes Congestive Heart Failure in her sister; Hypertension in her brother, brother, father, mother, sister, sister, and sister; Kidney disease in her brother, father, sister, and sister; Stroke in her mother.  ROS:   Please see the history of present illness.    All other systems reviewed and are negative.  EKGs/Labs/Other Studies Reviewed:    The following studies were reviewed today: Records  EKG:  The ekg ordered today demonstrates sinus rhythm with a QTc of 479 ms.  Recent Labs:  From care everywhere Hemoglobin 10.9 on June 02, 2020 Creatinine 1.9 on September 23, 2020.  GFR estimated at 25.          Recent Lipid Panel No results found for: CHOL, TRIG, HDL, CHOLHDL, VLDL, LDLCALC, LDLDIRECT  Physical Exam:    VS:  BP 132/70   Pulse 78   Ht 5' 3.5" (1.613 m)   Wt 142 lb 6.4 oz (64.6 kg)   SpO2 96%   BMI 24.83 kg/m     Wt Readings from Last 3 Encounters:  10/05/20 142 lb 6.4 oz (64.6 kg)  03/16/19 140 lb (63.5 kg)  05/06/17 146 lb (66.2 kg)     GEN: Well nourished, well developed in no acute distress HEENT: Normal NECK: No JVD; No carotid bruits LYMPHATICS: No lymphadenopathy CARDIAC: RRR, no murmurs, rubs, gallops RESPIRATORY:  Clear to auscultation without rales, wheezing or rhonchi  ABDOMEN: Soft, non-tender, non-distended MUSCULOSKELETAL:  No edema; No deformity  SKIN: Warm and dry NEUROLOGIC:  Alert and oriented x 3 PSYCHIATRIC:  Normal affect   ASSESSMENT:    1. Paroxysmal atrial fibrillation (HCC)   2. CKD (chronic kidney disease) stage  4, GFR 15-29 ml/min (HCC)   3. Primary hypertension   4. Type 2 diabetes mellitus with complication, without long-term current use of insulin (HCC)    PLAN:    In order of problems listed above:  1. History of paroxysmal atrial fibrillation Currently diagnosed in 2004.  Unclear what the circumstances were around the time of diagnosis.  At that time she was started on sotalol and is remained on sotalol since then.  She is not currently on anticoagulation due to poor renal function and a mention of an anemia. Ms. Mcdevitt's renal function is severely decreased with a GFR of 25.  With the  kidney function this poor, sotalol is contraindicated.  I would first recommend that we stop the sotalol today.  We will place a 2-week ZIO monitor on the patient to help assess for any atrial fibrillation that is subclinical.  If the ZIO monitor does not show atrial fibrillation in the next 2 weeks, we will plan to implant a loop recorder for ongoing A. fib surveillance.  If atrial fibrillation is detected, will likely start amiodarone for rhythm control.  She will also be started on Eliquis if we detect atrial fibrillation.  2.  Chronic kidney disease stage IV, GFR 25 She is under the care of a nephrologist, Dr. Ruthann Cancer.  3.  Hypertension, uncontrolled I do not think as needed clonidine is a good strategy given the potential for withdrawal hypertension.  I have asked her to stop taking this now.  We will use hydralazine as needed doses in addition to her standing hydralazine to help control these isolated elevated episodes.  4.  Diabetes  Follow-up 3 months  Medication Adjustments/Labs and Tests Ordered: Current medicines are reviewed at length with the patient today.  Concerns regarding medicines are outlined above.  Orders Placed This Encounter  Procedures  . LONG TERM MONITOR (3-14 DAYS)   No orders of the defined types were placed in this encounter.    Signed, Lars Mage, MD, [facility]    10/05/2020 3:50 PM    Electrophysiology [facility]       ------------------------------------------------------------------------------------------------------------------   Addendum October 05, 2020, 4:29 PM  Received records from [facility]  May 29, 2020 echo report Left ventricular function normal, 60% Right ventricular function normal No significant valvular abnormalities Mild concentric left ventricular hypertrophy  June 04, 2020 lower extremity duplex No DVT  Lysbeth Galas T. Quentin Ore, MD, [facility] Cardiac Electrophysiology

## 2020-10-05 NOTE — Progress Notes (Signed)
Patient ID: Veronica Jimenez, female   DOB: [birth year], 74 y.o.   MRN: [id number] Patient enrolled for [employer] to ship a 14 day ZIO XT long term holter monitor to her home.

## 2020-10-05 NOTE — Patient Instructions (Addendum)
Medication Instructions:  Your physician has recommended you make the following change in your medication:  1.  STOP taking sotalol  2.  STOP taking clonidine  3.  Hydralazine 25 mg-  If your systolic blood pressure (top number) is greater than 180 take one additional tablet of your hydralazine 25 mg by mouth.  Labwork: None ordered.  Testing/Procedures: Your physician has recommended that you wear a holter monitor. Holter monitors are medical devices that record the heart's electrical activity. Doctors most often use these monitors to diagnose arrhythmias. Arrhythmias are problems with the speed or rhythm of the heartbeat. The monitor is a small, portable device. You can wear one while you do your normal daily activities. This is usually used to diagnose what is causing palpitations/syncope (passing out).  You will wear a ZIO monitor for 14 days   Follow-Up: Your physician wants you to follow-up in: 3 months with Dr. Quentin Ore.    December 29, 2020 at 11:30 am at the [facility] office    Any Other Special Instructions Will Be Listed Below (If Applicable).  If you need a refill on your cardiac medications before your next appointment, please call your pharmacy.   ZIO XT- Long Term Monitor Instructions   Your physician has requested you wear your ZIO patch monitor 14_days.   This is a single patch monitor.  [employer] supplies one patch monitor per enrollment.  Additional stickers are not available.   Please do not apply patch if you will be having a Nuclear Stress Test, Echocardiogram, Cardiac CT, MRI, or Chest Xray during the time frame you would be wearing the monitor. The patch cannot be worn during these tests.  You cannot remove and re-apply the ZIO XT patch monitor.   Your ZIO patch monitor will be sent USPS Priority mail from [facility] directly to your home address. The monitor may also be mailed to a PO BOX if home delivery is not available.   It may take 3-5 days to  receive your monitor after you have been enrolled.   Once you have received you monitor, please review enclosed instructions.  Your monitor has already been registered assigning a specific monitor serial # to you.   Applying the monitor   Shave hair from upper left chest.   Hold abrader disc by orange tab.  Rub abrader in 40 strokes over left upper chest as indicated in your monitor instructions.   Clean area with 4 enclosed alcohol pads .  Use all pads to assure are is cleaned thoroughly.  Let dry.   Apply patch as indicated in monitor instructions.  Patch will be place under collarbone on left side of chest with arrow pointing upward.   Rub patch adhesive wings for 2 minutes.Remove white label marked "1".  Remove white label marked "2".  Rub patch adhesive wings for 2 additional minutes.   While looking in a mirror, press and release button in center of patch.  A small green light will flash 3-4 times .  This will be your only indicator the monitor has been turned on.     Do not shower for the first 24 hours.  You may shower after the first 24 hours.   Press button if you feel a symptom. You will hear a small click.  Record Date, Time and Symptom in the Patient Log Book.   When you are ready to remove patch, follow instructions on last 2 pages of Patient Log Book.  Stick patch monitor onto  last page of Patient Log Book.   Place Patient Log Book in [place] box.  Use locking tab on box and tape box closed securely.  The [employer] and [employer] has [employer] on it.  Please place in mailbox as soon as possible.  Your physician should have your test results approximately 7 days after the monitor has been mailed back to [facility].   Call [place] at [phone] if you have questions regarding your ZIO XT patch monitor.  Call them immediately if you see an orange light blinking on your monitor.   If your monitor falls off in less than 4 days contact our Monitor  department at [phone].  If your monitor becomes loose or falls off after 4 days call [employer] at [phone] for suggestions on securing your monitor.

## 2020-10-06 NOTE — Addendum Note (Signed)
Addended by: Bobby Rumpf C on: 10/06/2020 11:14 AM   Modules accepted: Orders

## 2020-10-11 ENCOUNTER — Ambulatory Visit (INDEPENDENT_AMBULATORY_CARE_PROVIDER_SITE_OTHER): Payer: Medicare HMO

## 2020-10-11 DIAGNOSIS — I480 Paroxysmal atrial fibrillation: Secondary | ICD-10-CM | POA: Diagnosis not present

## 2020-10-12 ENCOUNTER — Ambulatory Visit: Payer: Medicare HMO | Admitting: Cardiology

## 2020-10-22 ENCOUNTER — Other Ambulatory Visit: Payer: Self-pay

## 2020-10-22 MED ORDER — HYDRALAZINE HCL 25 MG PO TABS
ORAL_TABLET | ORAL | 3 refills | Status: DC
Start: 2020-10-22 — End: 2022-02-08

## 2020-10-22 MED ORDER — AMLODIPINE BESY-BENAZEPRIL HCL 10-40 MG PO CAPS
1.0000 | ORAL_CAPSULE | Freq: Every day | ORAL | 3 refills | Status: DC
Start: 2020-10-22 — End: 2021-10-25

## 2020-10-22 MED ORDER — PRAVASTATIN SODIUM 80 MG PO TABS
80.0000 mg | ORAL_TABLET | Freq: Every day | ORAL | 3 refills | Status: DC
Start: 2020-10-22 — End: 2022-02-09

## 2020-10-22 NOTE — Telephone Encounter (Signed)
[facility] mail order pharmacy is requesting a refill on amlodipine-benazepril and pravastatin. Would Dr. Quentin Ore like to refill these medications? Please address

## 2020-10-29 ENCOUNTER — Telehealth: Payer: Self-pay | Admitting: Cardiology

## 2020-10-29 NOTE — Telephone Encounter (Signed)
° ° °  Pt would like to speak with a nurse, she said she has some concerns about booster covid vaccine and would like to discuss it

## 2020-10-29 NOTE — Telephone Encounter (Signed)
Left message to call back  

## 2020-10-29 NOTE — Telephone Encounter (Signed)
Follow up:     Patient returning a call back from the nurse. Please call patient.

## 2020-10-30 NOTE — Telephone Encounter (Signed)
Returned patients call regarding the COVID booster shot. Patient is wondering is she should proceed with getting it. Advised patient that we are recommending all of our cardiac patients receive the shots unless otherwise contraindicated. Patient states she did okay with the first two shots but is now concerned because she thinks this booster may effect her in the long term. She is requesting Dr. Claudie Revering advisement specifically related to her history and recent Echocardiogram from June.  Will route to CL, MD for advisement.

## 2020-11-04 NOTE — Telephone Encounter (Signed)
I recommend she receive the booster shot.  Thanks,  CL   Called the patient and made her aware of the above recommendation from Dr. Quentin Ore.

## 2020-11-30 ENCOUNTER — Telehealth: Payer: Self-pay | Admitting: Cardiology

## 2020-11-30 NOTE — Telephone Encounter (Signed)
Returned call to Pt.  Advised Pt to continue medications as ordered.  Moved f/u appt to this Thursday 12/03/20 with Dr. Quentin Ore.    HM results available for review.

## 2020-11-30 NOTE — Telephone Encounter (Signed)
°  Pt c/o medication issue:  1. Name of Medication: sotalol (BETAPACE) 80 MG tablet  2. How are you currently taking this medication (dosage and times per day)? Pt was told to stop this medication at her last appt with Dr. Quentin Ore  3. Are you having a reaction (difficulty breathing--STAT)? Pt said her heart and BP are still high  4. What is your medication issue? Patient want ed to know if there was another medication that Dr. Quentin Ore could put her on to get her HR under control  Pt c/o BP issue: STAT if pt c/o blurred vision, one-sided weakness or slurred speech  1. What are your last 5 BP readings?  144/78 HR 80  155/83 HR 91 171/84 HR 101  2. Are you having any other symptoms (ex. Dizziness, headache, blurred vision, passed out)? occasional headache   3. What is your BP issue?  Patient is concerned that her BP is not managed without the above medication. The patient would like to come in and be seen sooner than her appt on 02.08.22

## 2020-12-03 ENCOUNTER — Ambulatory Visit: Payer: Medicare HMO | Admitting: Cardiology

## 2020-12-03 ENCOUNTER — Other Ambulatory Visit: Payer: Self-pay

## 2020-12-03 VITALS — BP 136/64 | HR 88 | Ht 63.5 in | Wt 142.0 lb

## 2020-12-03 DIAGNOSIS — N184 Chronic kidney disease, stage 4 (severe): Secondary | ICD-10-CM

## 2020-12-03 DIAGNOSIS — I480 Paroxysmal atrial fibrillation: Secondary | ICD-10-CM | POA: Diagnosis not present

## 2020-12-03 DIAGNOSIS — I10 Essential (primary) hypertension: Secondary | ICD-10-CM | POA: Diagnosis not present

## 2020-12-03 NOTE — Patient Instructions (Addendum)
Medication Instructions:  Your physician recommends that you continue on your current medications as directed. Please refer to the Current Medication list given to you today.  Labwork: None ordered.  Testing/Procedures: None ordered.  Follow-Up:  We will send a request to your insurance company to pre approve an implantable loop recorder.  When this has been approved we will schedule you for an office visit with DR. Quentin Ore to loop implant.  Any Other Special Instructions Will Be Listed Below (If Applicable).  If you need a refill on your cardiac medications before your next appointment, please call your pharmacy.   Loop recorder pamphlet given to patient.

## 2020-12-03 NOTE — Progress Notes (Signed)
Electrophysiology Office Follow up Visit Note:    Date:  12/03/2020   ID:  Veronica Jimenez, DOB 16-Jan-1946, MRN [id number]  PCP:  Alma Friendly, MD  [facility] Cardiologist:  No primary care provider on file.  [facility] Electrophysiologist:  Vickie Epley, MD    Interval History:    Veronica Jimenez is a 75 y.o. female who presents for a follow up visit. They were last seen in clinic October 05, 2020.  During that visit we discussed a prior diagnosis of atrial fibrillation dated back to 2004.  She told me in that appointment that she had been on sotalol since its initiation in 2004.  She was not on an anticoagulant despite no bleeding issues.  At that appointment given some renal dysfunction, her sotalol was discontinued.  We ordered a ZIO monitor for 2 weeks after that appointment to assess for the burden of atrial fibrillation.  She is warned that unfortunately it did not show any atrial fibrillation.  She presents today to discuss next steps.  She tells me that since her last appointment she has had several episodes of palpitations that were very brief.  Is unclear whether or not these episodes represent atrial fibrillation.     Past Medical History:  Diagnosis Date  . Diabetes mellitus without complication ([place])   . Hyperlipemia   . Hypertension     Past Surgical History:  Procedure Laterality Date  . TONSILLECTOMY      Current Medications: Current Meds  Medication Sig  . acetaminophen (TYLENOL) 500 MG tablet Take 500 mg by mouth every 6 (six) hours as needed.  Marland Kitchen albuterol (VENTOLIN HFA) 108 (90 Base) MCG/ACT inhaler Inhale 1-2 puffs into the lungs every 6 (six) hours as needed for wheezing or shortness of breath.  Marland Kitchen amLODipine-benazepril (LOTREL) 10-40 MG capsule Take 1 capsule by mouth daily.  . Ascorbic Acid (VITAMIN C PO) Take 1 tablet by mouth daily.  Marland Kitchen aspirin EC 81 MG tablet Take 81 mg by mouth daily.  . calcium-vitamin D (OSCAL WITH D) 250-125  MG-UNIT tablet Take 1 tablet by mouth daily.  . fexofenadine (ALLEGRA) 60 MG tablet Take 60 mg by mouth 3 times/day as needed-between meals & bedtime for allergies or rhinitis.  . furosemide (LASIX) 20 MG tablet Take 20 mg by mouth daily as needed for fluid or edema.  . hydrALAZINE (APRESOLINE) 25 MG tablet Take one tablet by mouth 3 times a day.  IF systolic blood pressure greater than 180-take an additional tablet once by mouth.  Marland Kitchen HYDROcodone-acetaminophen (NORCO/VICODIN) 5-325 MG tablet Take 1 tablet by mouth every 6 (six) hours as needed.  . linagliptin (TRADJENTA) 5 MG TABS tablet Take 5 mg by mouth daily.  Marland Kitchen LORazepam (ATIVAN) 0.5 MG tablet Take 0.5 mg by mouth every 8 (eight) hours.  . Magnesium 250 MG TABS Take 1 tablet by mouth daily.  . montelukast (SINGULAIR) 10 MG tablet Take 10 mg by mouth at bedtime.  Marland Kitchen omeprazole (PRILOSEC) 40 MG capsule Take 40 mg by mouth daily.  . pravastatin (PRAVACHOL) 80 MG tablet Take 1 tablet (80 mg total) by mouth daily.  . Tiotropium Bromide Monohydrate (SPIRIVA RESPIMAT) 1.25 MCG/ACT AERS 1-2 puffs as needed  . traMADol (ULTRAM) 50 MG tablet Take 50 mg by mouth every 6 (six) hours as needed.     Allergies:   Shellfish allergy   Social History   Socioeconomic History  . Marital status: Widowed    Spouse name: Not on file  .  Number of children: Not on file  . Years of education: Not on file  . Highest education level: Not on file  Occupational History  . Not on file  Tobacco Use  . Smoking status: Never Smoker  . Smokeless tobacco: Never Used  Vaping Use  . Vaping Use: Never used  Substance and Sexual Activity  . Alcohol use: No  . Drug use: No  . Sexual activity: Not Currently  Other Topics Concern  . Not on file  Social History Narrative  . Not on file   Social Determinants of Health   Financial Resource Strain: Not on file  Food Insecurity: Not on file  Transportation Needs: Not on file  Physical Activity: Not on file   Stress: Not on file  Social Connections: Not on file     Family History: The patient's family history includes Congestive Heart Failure in her sister; Hypertension in her brother, brother, father, mother, sister, sister, and sister; Kidney disease in her brother, father, sister, and sister; Stroke in her mother.  ROS:   Please see the history of present illness.    All other systems reviewed and are negative.  EKGs/Labs/Other Studies Reviewed:    The following studies were reviewed today:  November 10, 2020 ZIO personally reviewed HR 54-162, average 82bpm. Rare supraventricular and ventricular ectopy. 16 episodes of SVT, longest 8 beats at a rate of 91bpm. No atrial fibrillation detected. Patient triggered episodes correspond to sinus rhythm.    EKG:  The ekg ordered today demonstrates sinus rhythm  Recent Labs: No results found for requested labs within last 8760 hours.  Recent Lipid Panel No results found for: CHOL, TRIG, HDL, CHOLHDL, VLDL, LDLCALC, LDLDIRECT  Physical Exam:    VS:  BP 136/64   Pulse 88   Ht 5' 3.5" (1.613 m)   Wt 142 lb (64.4 kg)   SpO2 95%   BMI 24.76 kg/m     Wt Readings from Last 3 Encounters:  12/03/20 142 lb (64.4 kg)  10/05/20 142 lb 6.4 oz (64.6 kg)  03/16/19 140 lb (63.5 kg)     GEN:  Well nourished, well developed in no acute distress HEENT: Normal NECK: No JVD; No carotid bruits LYMPHATICS: No lymphadenopathy CARDIAC: RRR, no murmurs, rubs, gallops RESPIRATORY:  Clear to auscultation without rales, wheezing or rhonchi  ABDOMEN: Soft, non-tender, non-distended MUSCULOSKELETAL:  No edema; No deformity  SKIN: Warm and dry NEUROLOGIC:  Alert and oriented x 3 PSYCHIATRIC:  Normal affect   ASSESSMENT:    1. Paroxysmal atrial fibrillation (HCC)   2. CKD (chronic kidney disease) stage 4, GFR 15-29 ml/min (HCC)   3. Primary hypertension    PLAN:    In order of problems listed above:  1. Paroxysmal atrial fibrillation No  atrial fibrillation detected on ZIO monitor from December.  Her diagnosis dates back to 2004 but it is unclear if she has had any A. fib in the recent history.  When I first met her she was not on an anticoagulant.  Her sotalol has been discontinued given her chronic kidney disease.  Given that she is not currently on an anticoagulant I would like to have a plan in place for ongoing surveillance for atrial fibrillation.  We discussed intermittent monitoring loop recorder during today's visit and she would like to proceed with implanting a loop recorder.  We will plan on getting this scheduled.  Risks of loop recorder implant and expected recovery were discussed in detail with the patient during today's  visit.  2.  Chronic kidney disease Prevents the safe use of class III antiarrhythmics.  3.  Hypertension At goal during today's visit.  Continue current regimen.   Medication Adjustments/Labs and Tests Ordered: Current medicines are reviewed at length with the patient today.  Concerns regarding medicines are outlined above.  No orders of the defined types were placed in this encounter.  No orders of the defined types were placed in this encounter.    Signed, Lars Mage, MD, [facility]  12/03/2020 5:30 PM    Electrophysiology [place]

## 2020-12-21 ENCOUNTER — Telehealth: Payer: Self-pay | Admitting: Cardiology

## 2020-12-21 DIAGNOSIS — I480 Paroxysmal atrial fibrillation: Secondary | ICD-10-CM

## 2020-12-21 NOTE — Telephone Encounter (Signed)
° ° °  Pt been waiting for a call to schedule her procedure, she said her HR been elevated for the past 2 week

## 2020-12-21 NOTE — Telephone Encounter (Signed)
Patient states that she was waiting for someone to call her to schedule a loop recorder after it was approved by her insurance.

## 2020-12-24 ENCOUNTER — Encounter: Payer: Self-pay | Admitting: Radiology

## 2020-12-24 NOTE — Progress Notes (Signed)
Enrolled patient for a 30 [employer] Event Monitor to be mailed to patients home.

## 2020-12-24 NOTE — Telephone Encounter (Signed)
30 day event monitor ordered

## 2020-12-24 NOTE — Addendum Note (Signed)
Addended by: Willeen Cass A on: 12/24/2020 04:14 PM   Modules accepted: Orders

## 2020-12-24 NOTE — Telephone Encounter (Signed)
Patient returning call. She states she would like the monitor ordered.

## 2020-12-24 NOTE — Telephone Encounter (Signed)
Outreach made to Pt.  Advised that her insurance had denied her loop recorder placement unless she wore a 30 day consecutive monitor first.  Call was disconnected.  Left message for Pt requesting call back.

## 2020-12-24 NOTE — Telephone Encounter (Signed)
Left message for Pt to call back.  Pt aware to call office if she would like to wear a 30 day monitor.  3 month recall entered.

## 2020-12-25 ENCOUNTER — Telehealth: Payer: Self-pay | Admitting: Cardiology

## 2020-12-25 NOTE — Telephone Encounter (Signed)
New message  Pt said it does not feel like afib. Her heart rate is going up and when it goes up she can feel it.   STAT if HR is under 50 or over 120 (normal HR is 60-100 beats per minute)  1) What is your heart rate? 121-yesterday   2) Do you have a log of your heart rate readings (document readings)? Last 3 weeks. Sat.-117 Sun.-119 Mon-116 Wed-110 Thurs-121  3) Do you have any other symptoms? Lightheaded sometimes.  Pt asked for appt. Scheduled 2/15 with Amber S. She would like to speak to an Therapist, sports.

## 2020-12-25 NOTE — Telephone Encounter (Signed)
Left patient a message to call the office back. 

## 2020-12-29 ENCOUNTER — Ambulatory Visit: Payer: Medicare HMO | Admitting: Cardiology

## 2020-12-29 ENCOUNTER — Telehealth: Payer: Self-pay | Admitting: Cardiology

## 2020-12-29 NOTE — Telephone Encounter (Signed)
Lm to call back ./cy 

## 2020-12-29 NOTE — Telephone Encounter (Signed)
Veronica Jimenez at 12/29/2020 3:54 PM  Status: Signed    Patient was returning phone call

## 2020-12-29 NOTE — Telephone Encounter (Signed)
Per pt has elevated heart rate daily and may occur 2 times a day Per pt had one day that she had 3 episodes and these last 5-10 min Per pt feels"jittery or nervous" during the episodes Pt had labs done yesterday awaiting results  that were ordered by PCP and has follow up next week with PCP and also has appt with Chanetta Marshall NP next week Per pt insurance has denied loop recorder stating needs to have another 30 day monitor Will forward to [employer] and Dr Quentin Ore for review and recommendations ./cy

## 2020-12-29 NOTE — Telephone Encounter (Signed)
See 2/4 telephone note for further documentation on this

## 2020-12-29 NOTE — Telephone Encounter (Signed)
Patient was returning phone call 

## 2020-12-29 NOTE — Telephone Encounter (Signed)
Left message to call back  

## 2020-12-31 NOTE — Telephone Encounter (Signed)
Spoke with pt and she states that she received a message from [employer] that her monitor was delivered.  Verified address and pt states that is her correct address but she is staying at her daughters.  She is going to her house today to retrieve her mail.  Advised pt to get that placed once she retrieves it and to call the monitor company if any issues getting it started.  Pt verbalized understanding.

## 2020-12-31 NOTE — Telephone Encounter (Signed)
Follow Up:     Pt is returning the nurse call from yesterday.

## 2021-01-02 ENCOUNTER — Ambulatory Visit (INDEPENDENT_AMBULATORY_CARE_PROVIDER_SITE_OTHER): Payer: Medicare HMO

## 2021-01-02 DIAGNOSIS — I480 Paroxysmal atrial fibrillation: Secondary | ICD-10-CM | POA: Diagnosis not present

## 2021-01-05 ENCOUNTER — Other Ambulatory Visit: Payer: Self-pay

## 2021-01-05 ENCOUNTER — Ambulatory Visit: Payer: Medicare HMO | Admitting: Nurse Practitioner

## 2021-01-05 ENCOUNTER — Encounter: Payer: Self-pay | Admitting: Nurse Practitioner

## 2021-01-05 VITALS — BP 168/80 | HR 115 | Ht 63.5 in | Wt 144.0 lb

## 2021-01-05 DIAGNOSIS — N184 Chronic kidney disease, stage 4 (severe): Secondary | ICD-10-CM

## 2021-01-05 DIAGNOSIS — I10 Essential (primary) hypertension: Secondary | ICD-10-CM | POA: Diagnosis not present

## 2021-01-05 DIAGNOSIS — I480 Paroxysmal atrial fibrillation: Secondary | ICD-10-CM | POA: Diagnosis not present

## 2021-01-05 MED ORDER — METOPROLOL TARTRATE 25 MG PO TABS: 25.0000 mg | ORAL_TABLET | Freq: Two times a day (BID) | ORAL | 3 refills | Status: DC

## 2021-01-05 NOTE — Progress Notes (Signed)
Electrophysiology Office Note Date: 01/05/2021  ID:  Veronica Jimenez, DOB [birth year], MRN [id number]  PCP: Alma Friendly, MD Electrophysiologist: Quentin Ore  CC: palpitations follow up  Veronica Jimenez is a 75 y.o. female seen today for Dr Quentin Ore.  She presents today for routine electrophysiology followup.  Since last being seen in our clinic, the patient reports doing reasonably well.  BP and HR have been elevated at home and associated with jitteriness.  She is currently wearing 30 day monitor (insurance denied ILR until 30 day monitor worn).  She denies chest pain,dyspnea, PND, orthopnea, nausea, vomiting, syncope, edema, weight gain, or early satiety.  Past Medical History:  Diagnosis Date  . Diabetes mellitus without complication ([place])   . Hyperlipemia   . Hypertension    Past Surgical History:  Procedure Laterality Date  . TONSILLECTOMY      Current Outpatient Medications  Medication Sig Dispense Refill  . acetaminophen (TYLENOL) 500 MG tablet Take 500 mg by mouth every 6 (six) hours as needed.    Marland Kitchen albuterol (VENTOLIN HFA) 108 (90 Base) MCG/ACT inhaler Inhale 1-2 puffs into the lungs every 6 (six) hours as needed for wheezing or shortness of breath.    Marland Kitchen amLODipine-benazepril (LOTREL) 10-40 MG capsule Take 1 capsule by mouth daily. 90 capsule 3  . Ascorbic Acid (VITAMIN C PO) Take 1 tablet by mouth daily.    Marland Kitchen aspirin EC 81 MG tablet Take 81 mg by mouth daily.    . calcium-vitamin D (OSCAL WITH D) 250-125 MG-UNIT tablet Take 1 tablet by mouth daily.    . fexofenadine (ALLEGRA) 60 MG tablet Take 60 mg by mouth 3 times/day as needed-between meals & bedtime for allergies or rhinitis.    . furosemide (LASIX) 20 MG tablet Take 20 mg by mouth daily as needed for fluid or edema.    . hydrALAZINE (APRESOLINE) 25 MG tablet Take one tablet by mouth 3 times a day.  IF systolic blood pressure greater than 180-take an additional tablet once by mouth. 270 tablet 3  .  HYDROcodone-acetaminophen (NORCO/VICODIN) 5-325 MG tablet Take 1 tablet by mouth every 6 (six) hours as needed. 8 tablet 0  . linagliptin (TRADJENTA) 5 MG TABS tablet Take 5 mg by mouth daily.    Marland Kitchen LORazepam (ATIVAN) 0.5 MG tablet Take 0.5 mg by mouth every 8 (eight) hours.    . metoprolol tartrate (LOPRESSOR) 25 MG tablet Take 1 tablet (25 mg total) by mouth 2 (two) times daily. 180 tablet 3  . montelukast (SINGULAIR) 10 MG tablet Take 10 mg by mouth at bedtime.    Marland Kitchen omeprazole (PRILOSEC) 40 MG capsule Take 40 mg by mouth daily.    . pravastatin (PRAVACHOL) 80 MG tablet Take 1 tablet (80 mg total) by mouth daily. 90 tablet 3  . Tiotropium Bromide Monohydrate (SPIRIVA RESPIMAT) 1.25 MCG/ACT AERS 1-2 puffs every morning    . traMADol (ULTRAM) 50 MG tablet Take 50 mg by mouth every 6 (six) hours as needed.     No current facility-administered medications for this visit.    Allergies:   Shellfish allergy   Social History: Social History   Socioeconomic History  . Marital status: Widowed    Spouse name: Not on file  . Number of children: Not on file  . Years of education: Not on file  . Highest education level: Not on file  Occupational History  . Not on file  Tobacco Use  . Smoking status: Never Smoker  . Smokeless tobacco:  Never Used  Vaping Use  . Vaping Use: Never used  Substance and Sexual Activity  . Alcohol use: No  . Drug use: No  . Sexual activity: Not Currently  Other Topics Concern  . Not on file  Social History Narrative  . Not on file   Social Determinants of Health   Financial Resource Strain: Not on file  Food Insecurity: Not on file  Transportation Needs: Not on file  Physical Activity: Not on file  Stress: Not on file  Social Connections: Not on file  Intimate Partner Violence: Not on file    Family History: Family History  Problem Relation Age of Onset  . Hypertension Mother   . Stroke Mother   . Hypertension Father   . Kidney disease Father   .  Hypertension Sister   . Kidney disease Sister   . Hypertension Brother   . Kidney disease Brother   . Hypertension Sister   . Kidney disease Sister   . Congestive Heart Failure Sister   . Hypertension Sister   . Hypertension Brother     Review of Systems: All other systems reviewed and are otherwise negative except as noted above.   Physical Exam: VS:  BP (!) 168/80   Pulse (!) 115   Ht 5' 3.5" (1.613 m)   Wt 144 lb (65.3 kg)   SpO2 96%   BMI 25.11 kg/m  , BMI Body mass index is 25.11 kg/m. Wt Readings from Last 3 Encounters:  01/05/21 144 lb (65.3 kg)  12/03/20 142 lb (64.4 kg)  10/05/20 142 lb 6.4 oz (64.6 kg)    GEN- The patient is well appearing, alert and oriented x 3 today.   HEENT: normocephalic, atraumatic; sclera clear, conjunctiva pink; hearing intact; oropharynx clear; neck supple  Lungs- Clear to ausculation bilaterally, normal work of breathing.  No wheezes, rales, rhonchi Heart- Tachycardic regular rate and rhythm  GI- soft, non-tender, non-distended, bowel sounds present  Extremities- no clubbing, cyanosis, or edema  MS- no significant deformity or atrophy Skin- warm and dry, no rash or lesion  Psych- euthymic mood, full affect Neuro- strength and sensation are intact   EKG:  EKG is ordered today. The ekg ordered today shows sinus tach, rate 112  Recent Labs: No results found for requested labs within last 8760 hours.    Other studies Reviewed: Additional studies/ records that were reviewed today include: Dr Mardene Speak office notes  Assessment and Plan:  1.  Paroxysmal atrial fibrillation/palpitations No AF identified on Zio monitor.  ILR was recommended. Unfortunately, her insurance is requesting 30 day monitor prior to approval. She has been wearing 30 day monitor for 1 week EKG today shows ST. BP has also been elevated at home Will add metoprolol 25mg  bid.  If after 1 week SBP >140 and HR >90, increase to 50mg  bid.   2.  CKD Stable No  change required today  3.  HTN As above     Current medicines are reviewed at length with the patient today.   The patient does not have concerns regarding her medicines.  The following changes were made today:  none  Labs/ tests ordered today include:  Orders Placed This Encounter  Procedures  . EKG 12-Lead     Disposition:   Follow up with Dr Quentin Ore after monitor    Signed, Chanetta Marshall, NP 01/05/2021 10:14 AM   Veronica Jimenez [id number] [phone] (office) [phone] (fax)

## 2021-01-05 NOTE — Patient Instructions (Signed)
Medication Instructions:  Your physician has recommended you make the following change in your medication:   START: Metoprolol Tartrate 25mg  twice daily  *If you need a refill on your cardiac medications before your next appointment, please call your pharmacy*   Lab Work: None If you have labs (blood work) drawn today and your tests are completely normal, you will receive your results only by: Marland Kitchen MyChart Message (if you have MyChart) OR . A paper copy in the mail If you have any lab test that is abnormal or we need to change your treatment, we will call you to review the results.   Follow-Up: At [facility], you and your health needs are our priority.  As part of our continuing mission to provide you with exceptional heart care, we have created designated Provider Care Teams.  These Care Teams include your primary Cardiologist (physician) and Advanced Practice Providers (APPs -  Physician Assistants and Nurse Practitioners) who all work together to provide you with the care you need, when you need it.  Your next appointment:   02/02/2021  The format for your next appointment:   In Person  Provider:   Lars Mage, MD   Other Instructions After taking Metoprolol for 1 week. If your Systolic pressure is still over 140 and your Heart Rate is over 90 we want you to increase your Metoprolol to 50mg  (2 tablets) twice daily.

## 2021-02-02 ENCOUNTER — Ambulatory Visit: Payer: Medicare HMO | Admitting: Cardiology

## 2021-02-23 ENCOUNTER — Ambulatory Visit: Payer: Medicare HMO | Admitting: Cardiology

## 2021-02-23 DIAGNOSIS — N184 Chronic kidney disease, stage 4 (severe): Secondary | ICD-10-CM

## 2021-02-23 DIAGNOSIS — I10 Essential (primary) hypertension: Secondary | ICD-10-CM

## 2021-02-23 DIAGNOSIS — I480 Paroxysmal atrial fibrillation: Secondary | ICD-10-CM

## 2021-03-31 ENCOUNTER — Other Ambulatory Visit: Payer: Self-pay

## 2021-03-31 MED ORDER — METOPROLOL TARTRATE 25 MG PO TABS: 25.0000 mg | ORAL_TABLET | Freq: Two times a day (BID) | ORAL | 3 refills | Status: DC

## 2021-03-31 NOTE — Telephone Encounter (Signed)
Pt's medication was sent to pt's pharmacy as requested. Confirmation received.  °

## 2021-04-12 NOTE — Progress Notes (Signed)
Electrophysiology Office Follow up Visit Note:    Date:  04/13/2021   ID:  Veronica Jimenez, DOB 1945-12-11, MRN [id number]  PCP:  Alma Friendly, MD  [facility] Cardiologist:  None  [facility] Electrophysiologist:  Vickie Epley, MD    Interval History:    Veronica Jimenez is a 75 y.o. female who presents for a follow up visit.  They were last seen in clinic by Benjaman Pott, NP on January 05, 2021.  At that appointment she was wearing a 30-day monitor.  I had previously seen the patient on December 03, 2020 given a history of atrial fibrillation previously managed with sotalol.  At that appointment we reviewed a ZIO monitor which showed no evidence of atrial fibrillation.  Patient continued to complain of episodes of palpitations there was concern whether or not these could represent recurrent atrial fibrillation.  Initially, I wanted to implant a loop recorder but due to insurance coverage issues, a 30-day monitor was placed first.  She has since worn the 30-day monitor which showed no evidence of atrial fibrillation or other sustained arrhythmias.  Patient triggered episodes corresponded to sinus rhythm.  Today she presents to clinic to discuss loop recorder implantation given a recent -30-day monitor.  Today she tells me she is doing well.  She keeps a check on her blood pressures at home and they are typically in the 081K or 481E systolic.  No episodes of atrial fibrillation according to the patient.  No syncope or presyncope.  Past Medical History:  Diagnosis Date  . Diabetes mellitus without complication ([place])   . Hyperlipemia   . Hypertension     Past Surgical History:  Procedure Laterality Date  . TONSILLECTOMY      Current Medications: Current Meds  Medication Sig  . acetaminophen (TYLENOL) 500 MG tablet Take 500 mg by mouth every 6 (six) hours as needed.  Marland Kitchen albuterol (VENTOLIN HFA) 108 (90 Base) MCG/ACT inhaler Inhale 1-2 puffs into the lungs every 6  (six) hours as needed for wheezing or shortness of breath.  Marland Kitchen amLODipine-benazepril (LOTREL) 10-40 MG capsule Take 1 capsule by mouth daily.  . Ascorbic Acid (VITAMIN C PO) Take 1 tablet by mouth daily.  Marland Kitchen aspirin EC 81 MG tablet Take 81 mg by mouth daily.  . calcium-vitamin D (OSCAL WITH D) 250-125 MG-UNIT tablet Take 1 tablet by mouth daily.  . fexofenadine (ALLEGRA) 60 MG tablet Take 60 mg by mouth 3 times/day as needed-between meals & bedtime for allergies or rhinitis.  . furosemide (LASIX) 20 MG tablet Take 20 mg by mouth daily as needed for fluid or edema.  . hydrALAZINE (APRESOLINE) 25 MG tablet Take one tablet by mouth 3 times a day.  IF systolic blood pressure greater than 180-take an additional tablet once by mouth.  Marland Kitchen HYDROcodone-acetaminophen (NORCO/VICODIN) 5-325 MG tablet Take 1 tablet by mouth every 6 (six) hours as needed.  . linagliptin (TRADJENTA) 5 MG TABS tablet Take 5 mg by mouth daily.  Marland Kitchen LORazepam (ATIVAN) 0.5 MG tablet Take 0.5 mg by mouth every 8 (eight) hours.  . metoprolol tartrate (LOPRESSOR) 25 MG tablet Take 1 tablet (25 mg total) by mouth 2 (two) times daily.  . montelukast (SINGULAIR) 10 MG tablet Take 10 mg by mouth at bedtime.  Marland Kitchen omeprazole (PRILOSEC) 40 MG capsule Take 40 mg by mouth daily.  . pravastatin (PRAVACHOL) 80 MG tablet Take 1 tablet (80 mg total) by mouth daily.  . Tiotropium Bromide Monohydrate (SPIRIVA RESPIMAT) 1.25 MCG/ACT AERS  1-2 puffs every morning  . traMADol (ULTRAM) 50 MG tablet Take 50 mg by mouth every 6 (six) hours as needed.     Allergies:   Shellfish allergy   Social History   Socioeconomic History  . Marital status: Widowed    Spouse name: Not on file  . Number of children: Not on file  . Years of education: Not on file  . Highest education level: Not on file  Occupational History  . Not on file  Tobacco Use  . Smoking status: Never Smoker  . Smokeless tobacco: Never Used  Vaping Use  . Vaping Use: Never used  Substance  and Sexual Activity  . Alcohol use: No  . Drug use: No  . Sexual activity: Not Currently  Other Topics Concern  . Not on file  Social History Narrative  . Not on file   Social Determinants of Health   Financial Resource Strain: Not on file  Food Insecurity: Not on file  Transportation Needs: Not on file  Physical Activity: Not on file  Stress: Not on file  Social Connections: Not on file     Family History: The patient's family history includes Congestive Heart Failure in her sister; Hypertension in her brother, brother, father, mother, sister, sister, and sister; Kidney disease in her brother, father, sister, and sister; Stroke in her mother.  ROS:   Please see the history of present illness.    All other systems reviewed and are negative.  EKGs/Labs/Other Studies Reviewed:    The following studies were reviewed today:  February 02, 2021 30-day monitor personally reviewed HR 57 - 137, average 82bpm. Rare supraventricular and ventricular ectopy. No sustained arrhythmias. Patient triggered episodes correspond to sinus rhythm.  November 10, 2020 ZIO personally reviewed HR 54-162, average 82bpm. Rare supraventricular and ventricular ectopy. 16 episodes of SVT, longest 8 beats at a rate of 91bpm. No atrial fibrillation detected. Patient triggered episodes correspond to sinus rhythm.   Recent Labs: No results found for requested labs within last 8760 hours.  Recent Lipid Panel No results found for: CHOL, TRIG, HDL, CHOLHDL, VLDL, LDLCALC, LDLDIRECT  Physical Exam:    VS:  BP (!) 152/70   Pulse 74   Ht 5' 3.5" (1.613 m)   Wt 142 lb (64.4 kg)   SpO2 98%   BMI 24.76 kg/m    Manual recheck of her blood pressure by myself was 124/62.   Wt Readings from Last 3 Encounters:  04/13/21 142 lb (64.4 kg)  01/05/21 144 lb (65.3 kg)  12/03/20 142 lb (64.4 kg)     GEN:  Well nourished, well developed in no acute distress HEENT: Normal NECK: No JVD; No carotid  bruits LYMPHATICS: No lymphadenopathy CARDIAC: RRR, no murmurs, rubs, gallops RESPIRATORY:  Clear to auscultation without rales, wheezing or rhonchi  ABDOMEN: Soft, non-tender, non-distended MUSCULOSKELETAL:  No edema; No deformity  SKIN: Warm and dry NEUROLOGIC:  Alert and oriented x 3 PSYCHIATRIC:  Normal affect   ASSESSMENT:    1. Paroxysmal atrial fibrillation (HCC)   2. Palpitations   3. 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 Primary hypertension    PLAN:    In order of problems listed above:  1. Paroxysmal atrial fibrillation No evidence of A. fib on both her 30-day monitor and ZIO monitor.  I offered to implant a loop recorder for ongoing monitoring given her history of paroxysmal atrial fibrillation but the patient would like some time to think  about it.  I think this is very reasonable.  She will reach out to my clinic if she chooses to proceed with loop recorder implant.  2.  Hypertension Controlled.  Continue amlodipine, benazepril, hydralazine, metoprolol.  Follow-up as needed.  Medication Adjustments/Labs and Tests Ordered: Current medicines are reviewed at length with the patient today.  Concerns regarding medicines are outlined above.  No orders of the defined types were placed in this encounter.  No orders of the defined types were placed in this encounter.    Signed, Lars Mage, MD, [facility], [facility] 04/13/2021 11:10 AM    Electrophysiology [facility]

## 2021-04-13 ENCOUNTER — Encounter: Payer: Self-pay | Admitting: Cardiology

## 2021-04-13 ENCOUNTER — Other Ambulatory Visit: Payer: Self-pay

## 2021-04-13 ENCOUNTER — Ambulatory Visit: Payer: Medicare HMO | Admitting: Cardiology

## 2021-04-13 VITALS — BP 152/70 | HR 74 | Ht 63.5 in | Wt 142.0 lb

## 2021-04-13 DIAGNOSIS — N184 Chronic kidney disease, stage 4 (severe): Secondary | ICD-10-CM | POA: Diagnosis not present

## 2021-04-13 DIAGNOSIS — I480 Paroxysmal atrial fibrillation: Secondary | ICD-10-CM

## 2021-04-13 DIAGNOSIS — R002 Palpitations: Secondary | ICD-10-CM | POA: Diagnosis not present

## 2021-04-13 DIAGNOSIS — I10 Essential (primary) hypertension: Secondary | ICD-10-CM

## 2021-04-13 NOTE — Patient Instructions (Addendum)
Medication Instructions:  Your physician recommends that you continue on your current medications as directed. Please refer to the Current Medication list given to you today.  Labwork: None ordered.  Testing/Procedures: None ordered.  Follow-Up: Your physician wants you to follow-up in: As needed with Lars Mage, MD or one of the following Advanced Practice Providers on your designated Care Team:    Chanetta Marshall, NP  Tommye Standard, PA-C  Legrand Como "[place]" [place], [place]   You will receive a reminder letter in the mail two months in advance. If you don't receive a letter, please call our office to schedule the follow-up appointment.   Any Other Special Instructions Will Be Listed Below (If Applicable).  If you need a refill on your cardiac medications before your next appointment, please call your pharmacy.

## 2021-05-05 ENCOUNTER — Telehealth: Payer: Self-pay | Admitting: Cardiology

## 2021-05-05 NOTE — Telephone Encounter (Signed)
    Veronica Jimenez DOB:  01/02/1946  MRN:  [id number]   Primary Cardiologist: Dr. Quentin Ore  Chart reviewed as part of pre-operative protocol coverage. Given past medical history and time since last visit, based on ACC/AHA guidelines, Veronica Jimenez would be at acceptable risk for the planned procedure without further cardiovascular testing.   The patient has no hx of CAD. She was recently seen by Dr. Quentin Ore for follow up of palpitations. She wore a 30 day monitor that showed no evidence of AF. She is not on anticoagulation. She may hold ASA 3-5 days prior to procedure then resume when ok from a procedural standpoint thereafter.   The patient was advised that if she develops new symptoms prior to surgery to contact our office to arrange for a follow-up visit, and she verbalized understanding.  I will route this recommendation to the requesting party via Epic fax function and remove from pre-op pool.  Please call with questions.  Kathyrn Drown, NP 05/05/2021, 9:54 AM

## 2021-05-05 NOTE — Telephone Encounter (Signed)
   [facility] Pre-operative Risk Assessment    Request for surgical clearance:  What type of surgery is being performed? Right Breast Stereotacptic Biopsy  When is this surgery scheduled? 05/13/2021   What type of clearance is required (medical clearance vs. Pharmacy clearance to hold med vs. Both)? Medicine   Are there any medications that need to be held prior to surgery and how long?Aspirin needs to be held 3-5 days prior to procedure  Practice name and name of physician performing surgery? [facility], Dr. Moody Blas  What is your office phone number [phone]   7.   What is your office fax number [phone]  8.   Anesthesia type (None, local, MAC, general) ? Local    Larina Bras 05/05/2021, 9:45 AM  _________________________________________________________________   (provider comments below)

## 2021-05-13 ENCOUNTER — Encounter: Payer: Self-pay | Admitting: Physician Assistant

## 2021-05-13 ENCOUNTER — Telehealth: Payer: Self-pay | Admitting: Cardiology

## 2021-05-13 MED ORDER — METOPROLOL TARTRATE 50 MG PO TABS: 50.0000 mg | ORAL_TABLET | Freq: Two times a day (BID) | ORAL | 3 refills | Status: DC

## 2021-05-13 MED ORDER — METOPROLOL TARTRATE 25 MG PO TABS: 25.0000 mg | ORAL_TABLET | Freq: Two times a day (BID) | ORAL | 3 refills | Status: DC

## 2021-05-13 NOTE — Telephone Encounter (Signed)
error 

## 2021-05-13 NOTE — Telephone Encounter (Signed)
    Pt c/o medication issue:  1. Name of Medication:   metoprolol tartrate (LOPRESSOR) 25 MG tablet    2. How are you currently taking this medication (dosage and times per day)? Take 1 tablet (25 mg total) by mouth 2 (two) times daily.  3. Are you having a reaction (difficulty breathing--STAT)?   4. What is your medication issue? Pt said she told Dr. Quentin Ore to call in 50 mg instead of 25 mg, so she doesn't have to take 2 pill everyday because sometimes she forget. She said send metoprolol 50 mg to her pharmacy [id number] - [place] [employer], [place]

## 2021-05-13 NOTE — Telephone Encounter (Signed)
Left detailed message for Pt.  Advised that her metoprolol had to be taken twice a day, it is not a once a day medication.  Advised she get a pill box to help her remember to take her medications correctly  Advised to call back if she had any further concerns.

## 2021-05-14 MED ORDER — METOPROLOL TARTRATE 50 MG PO TABS: 50.0000 mg | ORAL_TABLET | Freq: Two times a day (BID) | ORAL | 3 refills | Status: DC

## 2021-05-14 NOTE — Telephone Encounter (Signed)
Pt calling back stating that Dr. Quentin Ore told her to try the metoprolol 25 mg 2 tablets BID and pt stated that this worked and lowered her blood pressure. Pt stated that she needed Dr. Quentin Ore to send in metoprolol 50 mg BID, so she would not have to take so many pills of the 25 mg tablets. Pt would like a call back concerning this matter. Please address

## 2021-05-14 NOTE — Addendum Note (Signed)
Addended by: Willeen Cass A on: 05/14/2021 04:18 PM   Modules accepted: Orders

## 2021-05-14 NOTE — Telephone Encounter (Signed)
Per review of last office note, Dr. Quentin Ore did not increase Pt's metoprolol to 50 mg PO BID.  However per Dr. Quentin Ore, if Pt would like to increase her metoprolol tartrate to 50 mg PO BID that is ok.  New prescription sent to Pt's pharmacy.

## 2021-10-20 ENCOUNTER — Emergency Department (HOSPITAL_BASED_OUTPATIENT_CLINIC_OR_DEPARTMENT_OTHER): Payer: Medicare HMO

## 2021-10-20 ENCOUNTER — Encounter (HOSPITAL_BASED_OUTPATIENT_CLINIC_OR_DEPARTMENT_OTHER): Payer: Self-pay

## 2021-10-20 ENCOUNTER — Other Ambulatory Visit: Payer: Self-pay

## 2021-10-20 ENCOUNTER — Inpatient Hospital Stay (HOSPITAL_BASED_OUTPATIENT_CLINIC_OR_DEPARTMENT_OTHER)
Admission: EM | Admit: 2021-10-20 | Discharge: 2021-10-25 | DRG: 193 | Disposition: A | Discharge status: Home Health | Payer: Medicare HMO | Attending: Internal Medicine | Admitting: Internal Medicine

## 2021-10-20 DIAGNOSIS — Z7401 Bed confinement status: Secondary | ICD-10-CM

## 2021-10-20 DIAGNOSIS — I129 Hypertensive chronic kidney disease with stage 1 through stage 4 chronic kidney disease, or unspecified chronic kidney disease: Secondary | ICD-10-CM | POA: Diagnosis present

## 2021-10-20 DIAGNOSIS — Z20822 Contact with and (suspected) exposure to covid-19: Secondary | ICD-10-CM | POA: Diagnosis present

## 2021-10-20 DIAGNOSIS — S42211A Unspecified displaced fracture of surgical neck of right humerus, initial encounter for closed fracture: Secondary | ICD-10-CM | POA: Diagnosis not present

## 2021-10-20 DIAGNOSIS — Y92003 Bedroom of unspecified non-institutional (private) residence as the place of occurrence of the external cause: Secondary | ICD-10-CM

## 2021-10-20 DIAGNOSIS — S42309A Unspecified fracture of shaft of humerus, unspecified arm, initial encounter for closed fracture: Secondary | ICD-10-CM | POA: Diagnosis present

## 2021-10-20 DIAGNOSIS — Z86000 Personal history of in-situ neoplasm of breast: Secondary | ICD-10-CM | POA: Diagnosis not present

## 2021-10-20 DIAGNOSIS — N179 Acute kidney failure, unspecified: Secondary | ICD-10-CM | POA: Diagnosis present

## 2021-10-20 DIAGNOSIS — I959 Hypotension, unspecified: Secondary | ICD-10-CM | POA: Diagnosis not present

## 2021-10-20 DIAGNOSIS — W06XXXA Fall from bed, initial encounter: Secondary | ICD-10-CM | POA: Diagnosis present

## 2021-10-20 DIAGNOSIS — Z9011 Acquired absence of right breast and nipple: Secondary | ICD-10-CM

## 2021-10-20 DIAGNOSIS — J9601 Acute respiratory failure with hypoxia: Secondary | ICD-10-CM | POA: Diagnosis present

## 2021-10-20 DIAGNOSIS — Z823 Family history of stroke: Secondary | ICD-10-CM

## 2021-10-20 DIAGNOSIS — D649 Anemia, unspecified: Secondary | ICD-10-CM | POA: Diagnosis present

## 2021-10-20 DIAGNOSIS — S00212A Abrasion of left eyelid and periocular area, initial encounter: Secondary | ICD-10-CM | POA: Diagnosis present

## 2021-10-20 DIAGNOSIS — D61818 Other pancytopenia: Secondary | ICD-10-CM | POA: Diagnosis present

## 2021-10-20 DIAGNOSIS — Z91013 Allergy to seafood: Secondary | ICD-10-CM

## 2021-10-20 DIAGNOSIS — Z8701 Personal history of pneumonia (recurrent): Secondary | ICD-10-CM | POA: Diagnosis not present

## 2021-10-20 DIAGNOSIS — Z79899 Other long term (current) drug therapy: Secondary | ICD-10-CM

## 2021-10-20 DIAGNOSIS — S42291A Other displaced fracture of upper end of right humerus, initial encounter for closed fracture: Secondary | ICD-10-CM

## 2021-10-20 DIAGNOSIS — R7989 Other specified abnormal findings of blood chemistry: Secondary | ICD-10-CM | POA: Diagnosis not present

## 2021-10-20 DIAGNOSIS — Z853 Personal history of malignant neoplasm of breast: Secondary | ICD-10-CM

## 2021-10-20 DIAGNOSIS — I480 Paroxysmal atrial fibrillation: Secondary | ICD-10-CM | POA: Diagnosis present

## 2021-10-20 DIAGNOSIS — K625 Hemorrhage of anus and rectum: Secondary | ICD-10-CM | POA: Diagnosis present

## 2021-10-20 DIAGNOSIS — J101 Influenza due to other identified influenza virus with other respiratory manifestations: Secondary | ICD-10-CM | POA: Diagnosis present

## 2021-10-20 DIAGNOSIS — Z7982 Long term (current) use of aspirin: Secondary | ICD-10-CM

## 2021-10-20 DIAGNOSIS — R059 Cough, unspecified: Secondary | ICD-10-CM | POA: Diagnosis present

## 2021-10-20 DIAGNOSIS — Z885 Allergy status to narcotic agent status: Secondary | ICD-10-CM | POA: Diagnosis not present

## 2021-10-20 DIAGNOSIS — E1122 Type 2 diabetes mellitus with diabetic chronic kidney disease: Secondary | ICD-10-CM | POA: Diagnosis present

## 2021-10-20 DIAGNOSIS — J449 Chronic obstructive pulmonary disease, unspecified: Secondary | ICD-10-CM | POA: Diagnosis not present

## 2021-10-20 DIAGNOSIS — Z8249 Family history of ischemic heart disease and other diseases of the circulatory system: Secondary | ICD-10-CM | POA: Diagnosis not present

## 2021-10-20 DIAGNOSIS — E785 Hyperlipidemia, unspecified: Secondary | ICD-10-CM | POA: Diagnosis present

## 2021-10-20 DIAGNOSIS — N184 Chronic kidney disease, stage 4 (severe): Secondary | ICD-10-CM | POA: Diagnosis present

## 2021-10-20 DIAGNOSIS — R0602 Shortness of breath: Secondary | ICD-10-CM

## 2021-10-20 DIAGNOSIS — R42 Dizziness and giddiness: Secondary | ICD-10-CM | POA: Diagnosis present

## 2021-10-20 DIAGNOSIS — R000 Tachycardia, unspecified: Secondary | ICD-10-CM | POA: Diagnosis present

## 2021-10-20 DIAGNOSIS — I10 Essential (primary) hypertension: Secondary | ICD-10-CM | POA: Diagnosis present

## 2021-10-20 DIAGNOSIS — Z841 Family history of disorders of kidney and ureter: Secondary | ICD-10-CM | POA: Diagnosis not present

## 2021-10-20 HISTORY — DX: Malignant (primary) neoplasm, unspecified: C80.1

## 2021-10-20 LAB — COMPREHENSIVE METABOLIC PANEL
ALT: 13 U/L (ref 0–44)
AST: 20 U/L (ref 15–41)
Albumin: 3.3 g/dL — ABNORMAL LOW (ref 3.5–5.0)
Alkaline Phosphatase: 51 U/L (ref 38–126)
Anion gap: 12 (ref 5–15)
BUN: 44 mg/dL — ABNORMAL HIGH (ref 8–23)
CO2: 17 mmol/L — ABNORMAL LOW (ref 22–32)
Calcium: 9.6 mg/dL (ref 8.9–10.3)
Chloride: 102 mmol/L (ref 98–111)
Creatinine, Ser: 3.58 mg/dL — ABNORMAL HIGH (ref 0.44–1.00)
GFR, Estimated: 13 mL/min — ABNORMAL LOW (ref >60)
Glucose, Bld: 124 mg/dL — ABNORMAL HIGH (ref 70–99)
Potassium: 3.8 mmol/L (ref 3.5–5.1)
Sodium: 131 mmol/L — ABNORMAL LOW (ref 135–145)
Total Bilirubin: 0.3 mg/dL (ref 0.3–1.2)
Total Protein: 6.5 g/dL (ref 6.5–8.1)

## 2021-10-20 LAB — URINALYSIS, MICROSCOPIC (REFLEX)

## 2021-10-20 LAB — URINALYSIS, ROUTINE W REFLEX MICROSCOPIC
Bilirubin Urine: NEGATIVE
Glucose, UA: NEGATIVE mg/dL
Hgb urine dipstick: NEGATIVE
Ketones, ur: NEGATIVE mg/dL
Leukocytes,Ua: NEGATIVE
Nitrite: NEGATIVE
Protein, ur: >300 mg/dL — AB
Specific Gravity, Urine: 1.025 (ref 1.005–1.030)
pH: 5.5 (ref 5.0–8.0)

## 2021-10-20 LAB — RESP PANEL BY RT-PCR (FLU A&B, COVID) ARPGX2
Influenza A by PCR: POSITIVE — AB
Influenza B by PCR: NEGATIVE
SARS Coronavirus 2 by RT PCR: NEGATIVE

## 2021-10-20 LAB — CBC WITH DIFFERENTIAL/PLATELET
Abs Immature Granulocytes: 0.12 10*3/uL — ABNORMAL HIGH (ref 0.00–0.07)
Basophils Absolute: 0 10*3/uL (ref 0.0–0.1)
Basophils Relative: 0 %
Eosinophils Absolute: 0 10*3/uL (ref 0.0–0.5)
Eosinophils Relative: 0 %
HCT: 28.2 % — ABNORMAL LOW (ref 36.0–46.0)
Hemoglobin: 9.6 g/dL — ABNORMAL LOW (ref 12.0–15.0)
Immature Granulocytes: 1 %
Lymphocytes Relative: 9 %
Lymphs Abs: 1 10*3/uL (ref 0.7–4.0)
MCH: 31.7 pg (ref 26.0–34.0)
MCHC: 34 g/dL (ref 30.0–36.0)
MCV: 93.1 fL (ref 80.0–100.0)
Monocytes Absolute: 1.7 10*3/uL — ABNORMAL HIGH (ref 0.1–1.0)
Monocytes Relative: 16 %
Neutro Abs: 7.6 10*3/uL (ref 1.7–7.7)
Neutrophils Relative %: 74 %
Platelets: 178 10*3/uL (ref 150–400)
RBC: 3.03 MIL/uL — ABNORMAL LOW (ref 3.87–5.11)
RDW: 12.9 % (ref 11.5–15.5)
WBC: 10.4 10*3/uL (ref 4.0–10.5)
nRBC: 0 % (ref 0.0–0.2)

## 2021-10-20 LAB — LACTIC ACID, PLASMA
Lactic Acid, Venous: 0.8 mmol/L (ref 0.5–1.9)
Lactic Acid, Venous: 0.8 mmol/L (ref 0.5–1.9)

## 2021-10-20 LAB — PROTIME-INR
INR: 1.1 (ref 0.8–1.2)
Prothrombin Time: 13.7 seconds (ref 11.4–15.2)

## 2021-10-20 LAB — D-DIMER, QUANTITATIVE: D-Dimer, Quant: 16.41 ug/mL-FEU — ABNORMAL HIGH (ref 0.00–0.50)

## 2021-10-20 MED ORDER — FENTANYL CITRATE PF 50 MCG/ML IJ SOSY: 25.0000 ug | PREFILLED_SYRINGE | INTRAMUSCULAR | Status: DC | PRN

## 2021-10-20 MED ORDER — MORPHINE SULFATE (PF) 4 MG/ML IV SOLN
4.0000 mg | Freq: Once | INTRAVENOUS | Status: AC
  Administered 2021-10-20: 4 mg via INTRAVENOUS
  Filled 2021-10-20: qty 1

## 2021-10-20 MED ORDER — HEPARIN SODIUM (PORCINE) 5000 UNIT/ML IJ SOLN
5000.0000 [IU] | Freq: Three times a day (TID) | INTRAMUSCULAR | Status: DC
  Administered 2021-10-20 – 2021-10-25 (×14): 5000 [IU] via SUBCUTANEOUS
  Filled 2021-10-20 (×15): qty 1

## 2021-10-20 MED ORDER — MORPHINE SULFATE (PF) 4 MG/ML IV SOLN
6.0000 mg | Freq: Once | INTRAVENOUS | Status: AC
  Administered 2021-10-20: 6 mg via INTRAVENOUS
  Filled 2021-10-20: qty 2

## 2021-10-20 MED ORDER — FENTANYL CITRATE PF 50 MCG/ML IJ SOSY
12.5000 ug | PREFILLED_SYRINGE | Freq: Once | INTRAMUSCULAR | Status: AC
  Administered 2021-10-20: 12.5 ug via INTRAVENOUS
  Filled 2021-10-20: qty 1

## 2021-10-20 MED ORDER — ACETAMINOPHEN 325 MG PO TABS: 650.0000 mg | ORAL_TABLET | Freq: Four times a day (QID) | ORAL | Status: DC | PRN

## 2021-10-20 MED ORDER — MORPHINE SULFATE (PF) 2 MG/ML IV SOLN: 2.0000 mg | INTRAVENOUS | Status: DC | PRN

## 2021-10-20 MED ORDER — MORPHINE SULFATE (PF) 2 MG/ML IV SOLN
2.0000 mg | Freq: Once | INTRAVENOUS | Status: DC
Start: 2021-10-20 — End: 2021-10-20

## 2021-10-20 MED ORDER — OSELTAMIVIR PHOSPHATE 30 MG PO CAPS
30.0000 mg | ORAL_CAPSULE | Freq: Every day | ORAL | Status: AC
  Administered 2021-10-21 – 2021-10-25 (×5): 30 mg via ORAL
  Filled 2021-10-20 (×6): qty 1

## 2021-10-20 MED ORDER — HYDROCODONE-ACETAMINOPHEN 5-325 MG PO TABS
1.0000 | ORAL_TABLET | Freq: Four times a day (QID) | ORAL | Status: DC | PRN
  Administered 2021-10-21: 1 via ORAL
  Filled 2021-10-20: qty 1

## 2021-10-20 MED ORDER — SODIUM CHLORIDE 0.9 % IV BOLUS
1000.0000 mL | Freq: Once | INTRAVENOUS | Status: AC
  Administered 2021-10-20: 1000 mL via INTRAVENOUS

## 2021-10-20 MED ORDER — ONDANSETRON HCL 4 MG/2ML IJ SOLN
4.0000 mg | Freq: Once | INTRAMUSCULAR | Status: AC
  Administered 2021-10-20: 4 mg via INTRAVENOUS
  Filled 2021-10-20: qty 2

## 2021-10-20 NOTE — ED Provider Notes (Signed)
[facility] DEPARTMENT Provider Note   CSN: [id number] Arrival date & time: 10/20/21  1127     History Chief Complaint  Patient presents with   Arm Pain   Fall    Veronica Jimenez is a 75 y.o. female.  The history is provided by the patient and a significant other. No language interpreter was used.  Arm Pain  Fall     75 year old female significant history of breast cancer status postmastectomy currently not on any chemo agent, history of diabetes, hypertension, hyperlipidemia, presenting for evaluation of a fall.  Patient states she has been very sick for the past 2 weeks.  States she is having trouble with cough, congestion, shortness of breath for the duration.  Was previously seen by her PCP and was diagnosed with having pneumonia.  She did receive antibiotics including Z-Pak as well as steroids and despite the treatment she still endorse significant fatigue and weakness.  This morning she accidentally rolled off her bed, striking her face against a table and landed on the ground.  She was on the ground for a few hours until her son was able to attend to her needs and brought her here.  Patient states she did report feeling dizzy and weak when she got up.  She denies any loss of consciousness.  She did struck her shoulders against some hard surface and now complaining of pain to both of her shoulders as well as her face.  She is not on any blood thinner medication.  She did endorse nausea and vomiting several days ago but none since.  She denies any dysuria abdominal pain new focal numbness or weakness.  She has been using her rescue inhaler more than usual without adequate relief.  She report negative home COVID test   Past Medical History:  Diagnosis Date   Cancer ([place])    Diabetes mellitus without complication ([place])    Hyperlipemia    Hypertension     There are no problems to display for this patient.   Past Surgical History:  Procedure Laterality  Date   BREAST SURGERY     TONSILLECTOMY       OB History   No obstetric history on file.     Family History  Problem Relation Age of Onset   Hypertension Mother    Stroke Mother    Hypertension Father    Kidney disease Father    Hypertension Sister    Kidney disease Sister    Hypertension Brother    Kidney disease Brother    Hypertension Sister    Kidney disease Sister    Congestive Heart Failure Sister    Hypertension Sister    Hypertension Brother     Social History   Tobacco Use   Smoking status: Never   Smokeless tobacco: Never  Vaping Use   Vaping Use: Never used  Substance Use Topics   Alcohol use: No   Drug use: No    Home Medications Prior to Admission medications   Medication Sig Start Date End Date Taking? Authorizing Provider  acetaminophen (TYLENOL) 500 MG tablet Take 500 mg by mouth every 6 (six) hours as needed.    [provider]  albuterol (VENTOLIN HFA) 108 (90 Base) MCG/ACT inhaler Inhale 1-2 puffs into the lungs every 6 (six) hours as needed for wheezing or shortness of breath.    [provider]  amLODipine-benazepril (LOTREL) 10-40 MG capsule Take 1 capsule by mouth daily. 10/22/20   Vickie Epley,  MD  Ascorbic Acid (VITAMIN C PO) Take 1 tablet by mouth daily.    [provider]  aspirin EC 81 MG tablet Take 81 mg by mouth daily.    [provider]  calcium-vitamin D (OSCAL WITH D) 250-125 MG-UNIT tablet Take 1 tablet by mouth daily.    [provider]  fexofenadine (ALLEGRA) 60 MG tablet Take 60 mg by mouth 3 times/day as needed-between meals & bedtime for allergies or rhinitis.    [provider]  furosemide (LASIX) 20 MG tablet Take 20 mg by mouth daily as needed for fluid or edema.    [provider]  hydrALAZINE (APRESOLINE) 25 MG tablet Take one tablet by mouth 3 times a day.  IF systolic blood pressure greater than 180-take an additional tablet once by mouth. 10/22/20    Vickie Epley, MD  HYDROcodone-acetaminophen (NORCO/VICODIN) 5-325 MG tablet Take 1 tablet by mouth every 6 (six) hours as needed. 05/06/17   Quintella Reichert, MD  linagliptin (TRADJENTA) 5 MG TABS tablet Take 5 mg by mouth daily. 01/12/18   [provider]  LORazepam (ATIVAN) 0.5 MG tablet Take 0.5 mg by mouth every 8 (eight) hours.    [provider]  metoprolol tartrate (LOPRESSOR) 50 MG tablet Take 1 tablet (50 mg total) by mouth 2 (two) times daily. 05/14/21 08/12/21  Vickie Epley, MD  montelukast (SINGULAIR) 10 MG tablet Take 10 mg by mouth at bedtime.    [provider]  omeprazole (PRILOSEC) 40 MG capsule Take 40 mg by mouth daily.    [provider]  pravastatin (PRAVACHOL) 80 MG tablet Take 1 tablet (80 mg total) by mouth daily. 10/22/20   Vickie Epley, MD  Tiotropium Bromide Monohydrate (SPIRIVA RESPIMAT) 1.25 MCG/ACT AERS 1-2 puffs every morning    [provider]  traMADol (ULTRAM) 50 MG tablet Take 50 mg by mouth every 6 (six) hours as needed.    [provider]    Allergies    Shellfish allergy  Review of Systems   Review of Systems  All other systems reviewed and are negative.  Physical Exam Updated Vital Signs BP (!) 118/56 (BP Location: Left Arm)   Pulse 75   Temp 98.8 F (37.1 C) (Oral)   Resp 18   Ht 5\' 3"  (1.6 m)   Wt 64.9 kg   SpO2 94%   BMI 25.33 kg/m   Physical Exam Vitals and nursing note reviewed.  Constitutional:      General: She is not in acute distress.    Appearance: She is well-developed.     Comments: Ill-appearing elderly female actively coughing and appears to be in some discomfort  HENT:     Head: Normocephalic.     Comments: Linear skin abrasion noted to left orbital region with tenderness to palpation but no crepitus and no eye involvement Eyes:     Extraocular Movements: Extraocular movements intact.     Conjunctiva/sclera: Conjunctivae normal.     Pupils: Pupils are  equal, round, and reactive to light.  Neck:     Comments: No significant midline cervical spine tenderness Cardiovascular:     Rate and Rhythm: Normal rate and regular rhythm.     Pulses: Normal pulses.     Heart sounds: Normal heart sounds.  Pulmonary:     Effort: Pulmonary effort is normal.     Breath sounds: Rales (Faint crackles heard) present. No wheezing or rhonchi.  Abdominal:     Palpations: Abdomen is  soft.     Tenderness: There is no abdominal tenderness.  Musculoskeletal:        General: Tenderness (Tenderness to bilateral shoulder on palpation.  Faint ecchymosis noted to left shoulder.  Both with decreased range of motion but no obvious deformity noted.) present.     Cervical back: Normal range of motion and neck supple.  Skin:    Findings: No rash.  Neurological:     Mental Status: She is alert. Mental status is at baseline.  Psychiatric:        Mood and Affect: Mood normal.    ED Results / Procedures / Treatments   Labs (all labs ordered are listed, but only abnormal results are displayed) Labs Reviewed  RESP PANEL BY RT-PCR (FLU A&B, COVID) ARPGX2 - Abnormal; Notable for the following components:      Result Value   Influenza A by PCR POSITIVE (*)    All other components within normal limits  COMPREHENSIVE METABOLIC PANEL - Abnormal; Notable for the following components:   Sodium 131 (*)    CO2 17 (*)    Glucose, Bld 124 (*)    BUN 44 (*)    Creatinine, Ser 3.58 (*)    Albumin 3.3 (*)    GFR, Estimated 13 (*)    All other components within normal limits  CBC WITH DIFFERENTIAL/PLATELET - Abnormal; Notable for the following components:   RBC 3.03 (*)    Hemoglobin 9.6 (*)    HCT 28.2 (*)    Monocytes Absolute 1.7 (*)    Abs Immature Granulocytes 0.12 (*)    All other components within normal limits  URINALYSIS, ROUTINE W REFLEX MICROSCOPIC - Abnormal; Notable for the following components:   Protein, ur >300 (*)    All other components within normal  limits  D-DIMER, QUANTITATIVE - Abnormal; Notable for the following components:   D-Dimer, Quant 16.41 (*)    All other components within normal limits  URINALYSIS, MICROSCOPIC (REFLEX) - Abnormal; Notable for the following components:   Bacteria, UA FEW (*)    Non Squamous Epithelial PRESENT (*)    All other components within normal limits  CULTURE, BLOOD (ROUTINE X 2)  CULTURE, BLOOD (ROUTINE X 2)  LACTIC ACID, PLASMA  PROTIME-INR  LACTIC ACID, PLASMA    EKG EKG Interpretation  Date/Time:  Wednesday October 20 2021 11:52:10 EST Ventricular Rate:  74 PR Interval:  153 QRS Duration: 79 QT Interval:  397 QTC Calculation: 441 R Axis:   36 Text Interpretation: Sinus rhythm Confirmed by Thamas Jaegers ([id number]) on 10/20/2021 12:25:42 PM  Radiology DG Chest 2 View  Result Date: 10/20/2021 CLINICAL DATA:  Possible sepsis. EXAM: CHEST - 2 VIEW COMPARISON:  March 16, 2019. FINDINGS: The heart size and mediastinal contours are within normal limits. Both lungs are clear. The visualized skeletal structures are unremarkable. IMPRESSION: No active cardiopulmonary disease. Aortic Atherosclerosis (ICD10-I70.0). Electronically Signed   By: Marijo Conception M.D.   On: 10/20/2021 14:14   DG Shoulder Right  Result Date: 10/20/2021 CLINICAL DATA:  Right shoulder pain after fall last night. EXAM: RIGHT SHOULDER - 2+ VIEW COMPARISON:  None. FINDINGS: Moderately displaced fracture is seen involving the proximal right humeral head and neck. No definite dislocation is noted. IMPRESSION: Moderately displaced proximal right humeral head and neck fracture. Electronically Signed   By: Marijo Conception M.D.   On: 10/20/2021 14:17   CT Head Wo Contrast  Result Date: 10/20/2021 CLINICAL DATA:  Fall from bed, facial bruising  EXAM: CT HEAD WITHOUT CONTRAST CT MAXILLOFACIAL WITHOUT CONTRAST CT CERVICAL SPINE WITHOUT CONTRAST TECHNIQUE: Multidetector CT imaging of the head, cervical spine, and maxillofacial  structures were performed using the standard protocol without intravenous contrast. Multiplanar CT image reconstructions of the cervical spine and maxillofacial structures were also generated. COMPARISON:  None. FINDINGS: CT HEAD FINDINGS Brain: No evidence of acute infarction, hemorrhage, hydrocephalus, extra-axial collection or mass lesion/mass effect. Periventricular and deep white matter hypodensity. Vascular: No hyperdense vessel or unexpected calcification. CT FACIAL BONES FINDINGS Skull: Normal. Negative for fracture or focal lesion. Facial bones: No displaced fractures or dislocations. Sinuses/Orbits: No acute finding. Other: None. CT CERVICAL SPINE FINDINGS Alignment: Normal. Skull base and vertebrae: No acute fracture. No primary bone lesion or focal pathologic process. Soft tissues and spinal canal: No prevertebral fluid or swelling. No visible canal hematoma. Disc levels: Moderate multilevel disc space height loss and osteophytosis. Upper chest: Negative. Other: None. IMPRESSION: 1. No acute intracranial pathology. Small-vessel white matter disease. 2. No displaced fracture or dislocation of the facial bones. 3. No fracture or static subluxation of the cervical spine. 4. Moderate multilevel cervical disc degenerative disease. Electronically Signed   By: Delanna Ahmadi M.D.   On: 10/20/2021 14:25   CT Cervical Spine Wo Contrast  Result Date: 10/20/2021 CLINICAL DATA:  Fall from bed, facial bruising EXAM: CT HEAD WITHOUT CONTRAST CT MAXILLOFACIAL WITHOUT CONTRAST CT CERVICAL SPINE WITHOUT CONTRAST TECHNIQUE: Multidetector CT imaging of the head, cervical spine, and maxillofacial structures were performed using the standard protocol without intravenous contrast. Multiplanar CT image reconstructions of the cervical spine and maxillofacial structures were also generated. COMPARISON:  None. FINDINGS: CT HEAD FINDINGS Brain: No evidence of acute infarction, hemorrhage, hydrocephalus, extra-axial collection  or mass lesion/mass effect. Periventricular and deep white matter hypodensity. Vascular: No hyperdense vessel or unexpected calcification. CT FACIAL BONES FINDINGS Skull: Normal. Negative for fracture or focal lesion. Facial bones: No displaced fractures or dislocations. Sinuses/Orbits: No acute finding. Other: None. CT CERVICAL SPINE FINDINGS Alignment: Normal. Skull base and vertebrae: No acute fracture. No primary bone lesion or focal pathologic process. Soft tissues and spinal canal: No prevertebral fluid or swelling. No visible canal hematoma. Disc levels: Moderate multilevel disc space height loss and osteophytosis. Upper chest: Negative. Other: None. IMPRESSION: 1. No acute intracranial pathology. Small-vessel white matter disease. 2. No displaced fracture or dislocation of the facial bones. 3. No fracture or static subluxation of the cervical spine. 4. Moderate multilevel cervical disc degenerative disease. Electronically Signed   By: Delanna Ahmadi M.D.   On: 10/20/2021 14:25   DG Shoulder Left  Result Date: 10/20/2021 CLINICAL DATA:  Left shoulder injury after fall last night. EXAM: LEFT SHOULDER - 2+ VIEW COMPARISON:  None. FINDINGS: There is no evidence of fracture or dislocation. There is no evidence of arthropathy or other focal bone abnormality. Soft tissues are unremarkable. IMPRESSION: Negative. Electronically Signed   By: Marijo Conception M.D.   On: 10/20/2021 14:16   CT Maxillofacial Wo Contrast  Result Date: 10/20/2021 CLINICAL DATA:  Fall from bed, facial bruising EXAM: CT HEAD WITHOUT CONTRAST CT MAXILLOFACIAL WITHOUT CONTRAST CT CERVICAL SPINE WITHOUT CONTRAST TECHNIQUE: Multidetector CT imaging of the head, cervical spine, and maxillofacial structures were performed using the standard protocol without intravenous contrast. Multiplanar CT image reconstructions of the cervical spine and maxillofacial structures were also generated. COMPARISON:  None. FINDINGS: CT HEAD FINDINGS Brain: No  evidence of acute infarction, hemorrhage, hydrocephalus, extra-axial collection or mass lesion/mass effect. Periventricular and deep  white matter hypodensity. Vascular: No hyperdense vessel or unexpected calcification. CT FACIAL BONES FINDINGS Skull: Normal. Negative for fracture or focal lesion. Facial bones: No displaced fractures or dislocations. Sinuses/Orbits: No acute finding. Other: None. CT CERVICAL SPINE FINDINGS Alignment: Normal. Skull base and vertebrae: No acute fracture. No primary bone lesion or focal pathologic process. Soft tissues and spinal canal: No prevertebral fluid or swelling. No visible canal hematoma. Disc levels: Moderate multilevel disc space height loss and osteophytosis. Upper chest: Negative. Other: None. IMPRESSION: 1. No acute intracranial pathology. Small-vessel white matter disease. 2. No displaced fracture or dislocation of the facial bones. 3. No fracture or static subluxation of the cervical spine. 4. Moderate multilevel cervical disc degenerative disease. Electronically Signed   By: Delanna Ahmadi M.D.   On: 10/20/2021 14:25    Procedures Procedures   Medications Ordered in ED Medications  sodium chloride 0.9 % bolus 1,000 mL ( Intravenous Infusion Verify 10/20/21 1742)  morphine 4 MG/ML injection 4 mg (4 mg Intravenous Given 10/20/21 1308)  morphine 4 MG/ML injection 6 mg (6 mg Intravenous Given 10/20/21 1431)  ondansetron (ZOFRAN) injection 4 mg (4 mg Intravenous Given 10/20/21 1517)  morphine 4 MG/ML injection 4 mg (4 mg Intravenous Given 10/20/21 1743)    ED Course  I have reviewed the triage vital signs and the nursing notes.  Pertinent labs & imaging results that were available during my care of the patient were reviewed by me and considered in my medical decision making (see chart for details).    MDM Rules/Calculators/A&P                           BP 138/66   Pulse 86   Temp 98.8 F (37.1 C) (Oral)   Resp 20   Ht 5\' 3"  (1.6 m)   Wt 64.9 kg    SpO2 93%   BMI 25.33 kg/m   Final Clinical Impression(s) / ED Diagnoses Final diagnoses:  Influenza A  Acute respiratory failure with hypoxia ([facility])  Fracture of humeral head, closed, right, initial encounter  AKI (acute kidney injury) ([place])    Rx / DC Orders ED Discharge Orders     None      This is an elderly female who was recently diagnosed with pneumonia and have been deconditioned for the past 2 weeks mostly staying in bed.  She fell off her bed today and was unable to get up on her own.  She was not on the ground for an extended period of time.  Her son was able to bring her here.  She does have bruising across her left orbit and tenderness to bilateral shoulder.  She is not on any blood thinner medication.  Work-up initiated, anticipate admission.  She does endorse some shortness of breath, O2 sats of 94%.  She was trying to use rescue inhaler and have been using more than usual.  She does not have any significant wheezes on my initial exam.  D-dimer ordered.  CAre discussed with Dr. Almyra Free.   2:50 PM Labs remarkable for significant AKI with BUN 44, creatinine 3.58 this is markedly elevated from her baseline.  Her GFR is 13.  Bicarb is 17.  She has normal lactic acid and normal WBC.  Hemoglobin is 9.6.  Her viral respiratory panel was positive for influenza A.  She also has a markedly elevated D-dimer of 16.4 and a right shoulder x-ray demonstrating a moderate displaced proximal right humeral head  and neck fracture.  Head maxillofacial and cervical spine CT without acute intra cranial or displaced fracture or dislocation.  Nurse notified that her oxygen level is around 92%.  Will reposition patient and will continue to monitor oxygen level.  I have ordered a sling for her shoulder injury.  3:40 PM Appreciate consultation from [facility] hospitalist, Dr. Wyline Copas who agrees to admit patient either to [facility] or [facility].  He request for me to reach out to on-call orthopedic for further  managements of her right shoulder fracture.  4:46 PM Multiple attempts to consult on call orthopedist without success as he is currently in surgery.  Will awaits to consult once orthopedist is available  6:02 PM Appreciate consultation of orthopedist DR. Swinteck who recommend sling, pain management and outpt f/u with Dr. Campbell Lerner in 2 weeks.    Domenic Moras, PA-C 10/20/21 1835    Luna Fuse, MD 10/27/21 [phone]

## 2021-10-20 NOTE — ED Notes (Signed)
Pt. Returned from CT.

## 2021-10-20 NOTE — ED Notes (Signed)
ED Provider at bedside. 

## 2021-10-20 NOTE — H&P (Addendum)
History and Physical    Veronica Jimenez ZOX:[id number] DOB: 06/20/1946 DOA: 10/20/2021  PCP: Alma Friendly, MD  Patient coming from: [facility]  I have personally briefly reviewed patient's old medical records in [place]  Chief Complaint: Fall  HPI: Veronica Jimenez is a 75 y.o. 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  Patient reports that she has been slowly recovering from her right mastectomy back in September and felt that she has continued to decline.  About several weeks ago she began to feel sick with nausea, vomiting and diarrhea.  She was diagnosed with pneumonia and was started on antibiotic.  Never tested for influenza.  Her GI symptoms have resolved but she continues to feel weak and has mostly been bedbound.  Overnight she reports somehow falling off her bed and landing on her right side.  She laid down on the ground for about an hour due to pain and was finally able to get herself back up into bed.  She then told her son later in the morning and was taken to outside ED.  ED Course: She had a temperature of 100 Fahrenheit, BP of 154/65 and later was mildly hypoxic with O2 88 to 90% on room air and placed on 2 L. No leukocytosis, hemoglobin of 9.6. Sodium of 131, K of 3.8, creatinine of 3.58, BG of 124  D-dimer of 16.4  Head, maxillofacial and cervical CT negative for any f acute findings. X-ray shows displaced right humeral head and neck fracture.  Orthopedic Dr. Lyla Glassing with consulted by ED PA and recommended sling, pain management and outpatient follow-up with Dr. Campbell Lerner in 2 weeks. Patient was then transferred to [facility] for continued pain management.  Review of Systems: Constitutional: No  Weight Change, No Fever ENT/Mouth: No sore throat, No Rhinorrhea Eyes: No Eye Pain, No Vision Changes Cardiovascular: No Chest Pain, no SOB Respiratory: No Cough, No Sputum  Gastrointestinal: No Nausea, No Vomiting, No Diarrhea, No Constipation, No Pain Genitourinary: no Urinary Incontinence Musculoskeletal: No Arthralgias, No Myalgias Skin: No Skin Lesions, No Pruritus, Neuro: + Weakness, No Numbness Psych: No Anxiety/Panic, No Depression, no decrease appetite Heme/Lymph: No Bruising, No Bleeding   Past Medical History:  Diagnosis Date   Cancer ([place])    Diabetes mellitus without complication ([place])    Hyperlipemia    Hypertension     Past Surgical History:  Procedure Laterality Date   BREAST SURGERY     TONSILLECTOMY       reports that she has never smoked. She has never used smokeless tobacco. She reports that she does not drink alcohol and does not use drugs. Social History  Allergies  Allergen Reactions   Shellfish Allergy Shortness Of Breath    Family History  Problem Relation Age of Onset   Hypertension Mother    Stroke Mother    Hypertension Father    Kidney disease Father    Hypertension Sister    Kidney disease Sister    Hypertension Brother    Kidney disease Brother    Hypertension Sister    Kidney disease Sister    Congestive Heart Failure Sister    Hypertension Sister    Hypertension Brother      Prior to Admission medications   Medication Sig Start Date End  Date Taking? Authorizing Provider  acetaminophen (TYLENOL) 500 MG tablet Take 500 mg by mouth every 6 (six) hours as needed.    [provider]  albuterol (VENTOLIN HFA) 108 (90 Base) MCG/ACT inhaler Inhale 1-2 puffs into the lungs every 6 (six) hours as needed for wheezing or shortness of breath.    [provider]  amLODipine-benazepril (LOTREL) 10-40 MG capsule Take 1 capsule by mouth daily. 10/22/20   Vickie Epley, MD  Ascorbic Acid (VITAMIN C PO) Take 1 tablet by  mouth daily.    [provider]  aspirin EC 81 MG tablet Take 81 mg by mouth daily.    [provider]  calcium-vitamin D (OSCAL WITH D) 250-125 MG-UNIT tablet Take 1 tablet by mouth daily.    [provider]  fexofenadine (ALLEGRA) 60 MG tablet Take 60 mg by mouth 3 times/day as needed-between meals & bedtime for allergies or rhinitis.    [provider]  furosemide (LASIX) 20 MG tablet Take 20 mg by mouth daily as needed for fluid or edema.    [provider]  hydrALAZINE (APRESOLINE) 25 MG tablet Take one tablet by mouth 3 times a day.  IF systolic blood pressure greater than 180-take an additional tablet once by mouth. 10/22/20   Vickie Epley, MD  HYDROcodone-acetaminophen (NORCO/VICODIN) 5-325 MG tablet Take 1 tablet by mouth every 6 (six) hours as needed. 05/06/17   Quintella Reichert, MD  linagliptin (TRADJENTA) 5 MG TABS tablet Take 5 mg by mouth daily. 01/12/18   [provider]  LORazepam (ATIVAN) 0.5 MG tablet Take 0.5 mg by mouth every 8 (eight) hours.    [provider]  metoprolol tartrate (LOPRESSOR) 50 MG tablet Take 1 tablet (50 mg total) by mouth 2 (two) times daily. 05/14/21 08/12/21  Vickie Epley, MD  montelukast (SINGULAIR) 10 MG tablet Take 10 mg by mouth at bedtime.    [provider]  omeprazole (PRILOSEC) 40 MG capsule Take 40 mg by mouth daily.    [provider]  pravastatin (PRAVACHOL) 80 MG tablet Take 1 tablet (80 mg total) by mouth daily. 10/22/20   Vickie Epley, MD  Tiotropium Bromide Monohydrate (SPIRIVA RESPIMAT) 1.25 MCG/ACT AERS 1-2 puffs every morning    [provider]  traMADol (ULTRAM) 50 MG tablet Take 50 mg by mouth every 6 (six) hours as needed.    [provider]    Physical Exam: Vitals:   10/20/21 1540 10/20/21 1640 10/20/21 1848 10/20/21 2043  BP: (!) 142/65 138/66 (!) 154/65 (!) 158/67  Pulse: 93 86 94 95  Resp: 17 20 (!) 22 17  Temp:   100  F (37.8 C) 99.1 F (37.3 C)  TempSrc:   Oral Oral  SpO2: 93% 93% 94% 92%  Weight:      Height:        Constitutional: NAD, calm, comfortable elderly female appearing younger than stated age laying flat in bed Vitals:   10/20/21 1540 10/20/21 1640 10/20/21 1848 10/20/21 2043  BP: (!) 142/65 138/66 (!) 154/65 (!) 158/67  Pulse: 93 86 94 95  Resp: 17 20 (!) 22 17  Temp:   100 F (37.8 C) 99.1 F (37.3 C)  TempSrc:   Oral Oral  SpO2: 93% 93% 94% 92%  Weight:      Height:       Eyes: lids and conjunctivae normal ENMT: Mucous membranes are moist.  Neck: normal, supple Respiratory: Bibasilar rhonchi on 2 L via  nasal cannula. Normal respiratory effort. No accessory muscle use.  Cardiovascular: Regular rate and rhythm, no murmurs / rubs / gallops. No extremity edema. Abdomen: no tenderness, no masses palpated.  Bowel sounds positive.  Musculoskeletal: no clubbing / cyanosis. No joint deformity upper and lower extremities. Right UE in sling.  Skin: no rashes, lesions, ulcers. No induration Neurologic: CN 2-12 grossly intact. Sensation intact, Psychiatric: Normal judgment and insight. Alert and oriented x 3. Normal mood.    Labs on Admission: I have personally reviewed following labs and imaging studies  CBC: Recent Labs  Lab 10/20/21 1232  WBC 10.4  NEUTROABS 7.6  HGB 9.6*  HCT 28.2*  MCV 93.1  PLT 831   Basic Metabolic Panel: Recent Labs  Lab 10/20/21 1232  NA 131*  K 3.8  CL 102  CO2 17*  GLUCOSE 124*  BUN 44*  CREATININE 3.58*  CALCIUM 9.6   GFR: Estimated Creatinine Clearance: 12.1 mL/min (A) (by C-G formula based on SCr of 3.58 mg/dL (H)). Liver Function Tests: Recent Labs  Lab 10/20/21 1232  AST 20  ALT 13  ALKPHOS 51  BILITOT 0.3  PROT 6.5  ALBUMIN 3.3*   No results for input(s): LIPASE, AMYLASE in the last 168 hours. No results for input(s): AMMONIA in the last 168 hours. Coagulation Profile: Recent Labs  Lab 10/20/21 1232  INR 1.1    Cardiac Enzymes: No results for input(s): CKTOTAL, CKMB, CKMBINDEX, TROPONINI in the last 168 hours. BNP (last 3 results) No results for input(s): PROBNP in the last 8760 hours. HbA1C: No results for input(s): HGBA1C in the last 72 hours. CBG: No results for input(s): GLUCAP in the last 168 hours. Lipid Profile: No results for input(s): CHOL, HDL, LDLCALC, TRIG, CHOLHDL, LDLDIRECT in the last 72 hours. Thyroid Function Tests: No results for input(s): TSH, T4TOTAL, FREET4, T3FREE, THYROIDAB in the last 72 hours. Anemia Panel: No results for input(s): VITAMINB12, FOLATE, FERRITIN, TIBC, IRON, RETICCTPCT in the last 72 hours. Urine analysis:    Component Value Date/Time   COLORURINE YELLOW 10/20/2021 [place] 10/20/2021 1232   LABSPEC 1.025 10/20/2021 1232   PHURINE 5.5 10/20/2021 1232   GLUCOSEU NEGATIVE 10/20/2021 1232   HGBUR NEGATIVE 10/20/2021 1232   BILIRUBINUR NEGATIVE 10/20/2021 1232   KETONESUR NEGATIVE 10/20/2021 1232   PROTEINUR >300 (A) 10/20/2021 1232   NITRITE NEGATIVE 10/20/2021 1232   LEUKOCYTESUR NEGATIVE 10/20/2021 1232    Radiological Exams on Admission: DG Chest 2 View  Result Date: 10/20/2021 CLINICAL DATA:  Possible sepsis. EXAM: CHEST - 2 VIEW COMPARISON:  March 16, 2019. FINDINGS: The heart size and mediastinal contours are within normal limits. Both lungs are clear. The visualized skeletal structures are unremarkable. IMPRESSION: No active cardiopulmonary disease. Aortic Atherosclerosis (ICD10-I70.0). Electronically Signed   By: Marijo Conception M.D.   On: 10/20/2021 14:14   DG Shoulder Right  Result Date: 10/20/2021 CLINICAL DATA:  Right shoulder pain after fall last night. EXAM: RIGHT SHOULDER - 2+ VIEW COMPARISON:  None. FINDINGS: Moderately displaced fracture is seen involving the proximal right humeral head and neck. No definite dislocation is noted. IMPRESSION: Moderately displaced proximal right humeral head and neck fracture.  Electronically Signed   By: Marijo Conception M.D.   On: 10/20/2021 14:17   CT Head Wo Contrast  Result Date: 10/20/2021 CLINICAL DATA:  Fall from bed, facial bruising EXAM: CT HEAD WITHOUT CONTRAST CT MAXILLOFACIAL WITHOUT CONTRAST CT CERVICAL SPINE WITHOUT CONTRAST TECHNIQUE: Multidetector CT imaging of the head,  cervical spine, and maxillofacial structures were performed using the standard protocol without intravenous contrast. Multiplanar CT image reconstructions of the cervical spine and maxillofacial structures were also generated. COMPARISON:  None. FINDINGS: CT HEAD FINDINGS Brain: No evidence of acute infarction, hemorrhage, hydrocephalus, extra-axial collection or mass lesion/mass effect. Periventricular and deep white matter hypodensity. Vascular: No hyperdense vessel or unexpected calcification. CT FACIAL BONES FINDINGS Skull: Normal. Negative for fracture or focal lesion. Facial bones: No displaced fractures or dislocations. Sinuses/Orbits: No acute finding. Other: None. CT CERVICAL SPINE FINDINGS Alignment: Normal. Skull base and vertebrae: No acute fracture. No primary bone lesion or focal pathologic process. Soft tissues and spinal canal: No prevertebral fluid or swelling. No visible canal hematoma. Disc levels: Moderate multilevel disc space height loss and osteophytosis. Upper chest: Negative. Other: None. IMPRESSION: 1. No acute intracranial pathology. Small-vessel white matter disease. 2. No displaced fracture or dislocation of the facial bones. 3. No fracture or static subluxation of the cervical spine. 4. Moderate multilevel cervical disc degenerative disease. Electronically Signed   By: Delanna Ahmadi M.D.   On: 10/20/2021 14:25   CT Cervical Spine Wo Contrast  Result Date: 10/20/2021 CLINICAL DATA:  Fall from bed, facial bruising EXAM: CT HEAD WITHOUT CONTRAST CT MAXILLOFACIAL WITHOUT CONTRAST CT CERVICAL SPINE WITHOUT CONTRAST TECHNIQUE: Multidetector CT imaging of the head, cervical  spine, and maxillofacial structures were performed using the standard protocol without intravenous contrast. Multiplanar CT image reconstructions of the cervical spine and maxillofacial structures were also generated. COMPARISON:  None. FINDINGS: CT HEAD FINDINGS Brain: No evidence of acute infarction, hemorrhage, hydrocephalus, extra-axial collection or mass lesion/mass effect. Periventricular and deep white matter hypodensity. Vascular: No hyperdense vessel or unexpected calcification. CT FACIAL BONES FINDINGS Skull: Normal. Negative for fracture or focal lesion. Facial bones: No displaced fractures or dislocations. Sinuses/Orbits: No acute finding. Other: None. CT CERVICAL SPINE FINDINGS Alignment: Normal. Skull base and vertebrae: No acute fracture. No primary bone lesion or focal pathologic process. Soft tissues and spinal canal: No prevertebral fluid or swelling. No visible canal hematoma. Disc levels: Moderate multilevel disc space height loss and osteophytosis. Upper chest: Negative. Other: None. IMPRESSION: 1. No acute intracranial pathology. Small-vessel white matter disease. 2. No displaced fracture or dislocation of the facial bones. 3. No fracture or static subluxation of the cervical spine. 4. Moderate multilevel cervical disc degenerative disease. Electronically Signed   By: Delanna Ahmadi M.D.   On: 10/20/2021 14:25   DG Shoulder Left  Result Date: 10/20/2021 CLINICAL DATA:  Left shoulder injury after fall last night. EXAM: LEFT SHOULDER - 2+ VIEW COMPARISON:  None. FINDINGS: There is no evidence of fracture or dislocation. There is no evidence of arthropathy or other focal bone abnormality. Soft tissues are unremarkable. IMPRESSION: Negative. Electronically Signed   By: Marijo Conception M.D.   On: 10/20/2021 14:16   CT Maxillofacial Wo Contrast  Result Date: 10/20/2021 CLINICAL DATA:  Fall from bed, facial bruising EXAM: CT HEAD WITHOUT CONTRAST CT MAXILLOFACIAL WITHOUT CONTRAST CT CERVICAL  SPINE WITHOUT CONTRAST TECHNIQUE: Multidetector CT imaging of the head, cervical spine, and maxillofacial structures were performed using the standard protocol without intravenous contrast. Multiplanar CT image reconstructions of the cervical spine and maxillofacial structures were also generated. COMPARISON:  None. FINDINGS: CT HEAD FINDINGS Brain: No evidence of acute infarction, hemorrhage, hydrocephalus, extra-axial collection or mass lesion/mass effect. Periventricular and deep white matter hypodensity. Vascular: No hyperdense vessel or unexpected calcification. CT FACIAL BONES FINDINGS Skull: Normal. Negative for fracture or focal lesion.  Facial bones: No displaced fractures or dislocations. Sinuses/Orbits: No acute finding. Other: None. CT CERVICAL SPINE FINDINGS Alignment: Normal. Skull base and vertebrae: No acute fracture. No primary bone lesion or focal pathologic process. Soft tissues and spinal canal: No prevertebral fluid or swelling. No visible canal hematoma. Disc levels: Moderate multilevel disc space height loss and osteophytosis. Upper chest: Negative. Other: None. IMPRESSION: 1. No acute intracranial pathology. Small-vessel white matter disease. 2. No displaced fracture or dislocation of the facial bones. 3. No fracture or static subluxation of the cervical spine. 4. Moderate multilevel cervical disc degenerative disease. Electronically Signed   By: Delanna Ahmadi M.D.   On: 10/20/2021 14:25      Assessment/Plan Right displaced humeral head and neck fracture -Continue sling per orthopedic surgeon. -PRN hydrocodone for moderate pain and fentanyl for severe pain -Needs follow-up in 2 weeks with Dr. Campbell Lerner with orthopedics  Acute hypoxic respiratory failure Most likely secondary to influenza with underlying COPD.  She has significantly elevated D-dimer of 16.4-although more likely be due to her recent malignancy.  Unable to obtain a CTA chest with contrast given poor renal  function.  She does endorse being bedbound mostly for the past week, will obtain stat bilateral lower extremity venous Doppler ultrasound  Influenza A - will renally dosed oseltamivir  AKI on CKD stage IV -Creatinine elevated to 3.58.  Previously 2.4 in 06/2021 -Possibly received 1 L normal saline fluid in the ED  Anemia -Hemoglobin of 9.6 from 10.7 two days ago - Patient does report rectal bleeding about a week ago and then had 2 more episodes of bright red blood per rectum.  No active bleeding at this time only on aspirin daily and no other anticoagulation.  She recently was seen by [facility] on 11/28 and they have plan for possible Flex sigmoidoscopy at her own assessment. -Iron panel on 11/28 was normal -Recommend continued follow-up with GI outpatient  Paroxysmal atrial fibrillation -Not on anticoagulation in the past due to poor renal function.  She is being followed by cardiology and had Zio patch showing no A.fib finding so not resumed on anticoagulation  History of DCIS s/p right mastectomy - Follow with [facility] oncology  DVT prophylaxis:.Lovenox Code Status: Full Family Communication: Plan discussed with patient at bedside  disposition Plan: Home with at least 2 midnight stays  Consults called:  Admission status: inpatient   Level of care: Telemetry  Status is: Inpatient  Remains inpatient appropriate because: Right humeral head fracture requiring pain medication management         Orene Desanctis DO [facility]   If 7PM-7AM, please contact night-coverage [email or web address]   10/20/2021, 10:12 PM

## 2021-10-20 NOTE — ED Triage Notes (Signed)
Pt arrives to ED with reports of fall last night with pain to right arm. Pt states that she has been sick in bed for 2 weeks with pneumonia. Reports she got out of bed, became dizzy and fell does not think she had LOC but did hit her head and landed with her body weight on right arm on she thinks a box. Pt has bruising to left shoulder with pain in right shoulder and bruising to left eye. Pt does not take blood thinners.

## 2021-10-20 NOTE — ED Notes (Signed)
Oxygen sats 88-92% on RA O2 @ 2lpm/[place] started with sats increasing to 95%

## 2021-10-21 ENCOUNTER — Inpatient Hospital Stay (HOSPITAL_COMMUNITY): Payer: Medicare HMO

## 2021-10-21 DIAGNOSIS — D61818 Other pancytopenia: Secondary | ICD-10-CM | POA: Diagnosis present

## 2021-10-21 DIAGNOSIS — J9601 Acute respiratory failure with hypoxia: Secondary | ICD-10-CM | POA: Diagnosis present

## 2021-10-21 DIAGNOSIS — D649 Anemia, unspecified: Secondary | ICD-10-CM | POA: Insufficient documentation

## 2021-10-21 DIAGNOSIS — S42309A Unspecified fracture of shaft of humerus, unspecified arm, initial encounter for closed fracture: Secondary | ICD-10-CM | POA: Diagnosis present

## 2021-10-21 DIAGNOSIS — J101 Influenza due to other identified influenza virus with other respiratory manifestations: Secondary | ICD-10-CM | POA: Diagnosis present

## 2021-10-21 DIAGNOSIS — I480 Paroxysmal atrial fibrillation: Secondary | ICD-10-CM | POA: Diagnosis present

## 2021-10-21 DIAGNOSIS — Z86000 Personal history of in-situ neoplasm of breast: Secondary | ICD-10-CM | POA: Diagnosis present

## 2021-10-21 DIAGNOSIS — R7989 Other specified abnormal findings of blood chemistry: Secondary | ICD-10-CM

## 2021-10-21 LAB — CBC
HCT: 25.1 % — ABNORMAL LOW (ref 36.0–46.0)
Hemoglobin: 8.3 g/dL — ABNORMAL LOW (ref 12.0–15.0)
MCH: 31.7 pg (ref 26.0–34.0)
MCHC: 33.1 g/dL (ref 30.0–36.0)
MCV: 95.8 fL (ref 80.0–100.0)
Platelets: 131 10*3/uL — ABNORMAL LOW (ref 150–400)
RBC: 2.62 MIL/uL — ABNORMAL LOW (ref 3.87–5.11)
RDW: 13.1 % (ref 11.5–15.5)
WBC: 7.9 10*3/uL (ref 4.0–10.5)
nRBC: 0 % (ref 0.0–0.2)

## 2021-10-21 LAB — BASIC METABOLIC PANEL
Anion gap: 10 (ref 5–15)
BUN: 39 mg/dL — ABNORMAL HIGH (ref 8–23)
CO2: 14 mmol/L — ABNORMAL LOW (ref 22–32)
Calcium: 8.9 mg/dL (ref 8.9–10.3)
Chloride: 109 mmol/L (ref 98–111)
Creatinine, Ser: 3.21 mg/dL — ABNORMAL HIGH (ref 0.44–1.00)
GFR, Estimated: 14 mL/min — ABNORMAL LOW (ref >60)
Glucose, Bld: 87 mg/dL (ref 70–99)
Potassium: 4 mmol/L (ref 3.5–5.1)
Sodium: 133 mmol/L — ABNORMAL LOW (ref 135–145)

## 2021-10-21 LAB — CK: Total CK: 65 U/L (ref 38–234)

## 2021-10-21 MED ORDER — SODIUM CHLORIDE 0.9 % IV SOLN: INTRAVENOUS | Status: DC

## 2021-10-21 MED ORDER — SENNOSIDES-DOCUSATE SODIUM 8.6-50 MG PO TABS
1.0000 | ORAL_TABLET | Freq: Two times a day (BID) | ORAL | Status: DC
  Administered 2021-10-21 – 2021-10-25 (×8): 1 via ORAL
  Filled 2021-10-21 (×8): qty 1

## 2021-10-21 MED ORDER — HYDRALAZINE HCL 50 MG PO TABS
50.0000 mg | ORAL_TABLET | Freq: Three times a day (TID) | ORAL | Status: DC
  Administered 2021-10-21 – 2021-10-25 (×11): 50 mg via ORAL
  Filled 2021-10-21 (×14): qty 1

## 2021-10-21 MED ORDER — ACETAMINOPHEN 325 MG PO TABS
650.0000 mg | ORAL_TABLET | Freq: Four times a day (QID) | ORAL | Status: DC | PRN
  Administered 2021-10-21 – 2021-10-24 (×4): 650 mg via ORAL
  Filled 2021-10-21 (×4): qty 2

## 2021-10-21 MED ORDER — HYDROCODONE-ACETAMINOPHEN 5-325 MG PO TABS
1.0000 | ORAL_TABLET | Freq: Four times a day (QID) | ORAL | Status: DC | PRN
  Administered 2021-10-21 – 2021-10-22 (×4): 1 via ORAL
  Filled 2021-10-21 (×5): qty 1

## 2021-10-21 MED ORDER — ASPIRIN 81 MG PO CHEW
81.0000 mg | CHEWABLE_TABLET | Freq: Every day | ORAL | Status: DC
  Administered 2021-10-21 – 2021-10-25 (×5): 81 mg via ORAL
  Filled 2021-10-21 (×5): qty 1

## 2021-10-21 MED ORDER — ONDANSETRON 4 MG PO TBDP
4.0000 mg | ORAL_TABLET | Freq: Once | ORAL | Status: AC
  Administered 2021-10-21: 4 mg via ORAL
  Filled 2021-10-21: qty 1

## 2021-10-21 MED ORDER — PRAVASTATIN SODIUM 40 MG PO TABS
80.0000 mg | ORAL_TABLET | Freq: Every day | ORAL | Status: DC
  Administered 2021-10-21 – 2021-10-24 (×4): 80 mg via ORAL
  Filled 2021-10-21 (×4): qty 2

## 2021-10-21 MED ORDER — LORAZEPAM 0.5 MG PO TABS
0.5000 mg | ORAL_TABLET | Freq: Two times a day (BID) | ORAL | Status: DC | PRN
  Administered 2021-10-21 – 2021-10-23 (×3): 0.5 mg via ORAL
  Filled 2021-10-21 (×3): qty 1

## 2021-10-21 MED ORDER — METOPROLOL TARTRATE 50 MG PO TABS
50.0000 mg | ORAL_TABLET | Freq: Two times a day (BID) | ORAL | Status: DC
  Administered 2021-10-21 – 2021-10-25 (×9): 50 mg via ORAL
  Filled 2021-10-21 (×9): qty 1

## 2021-10-21 MED ORDER — ORAL CARE MOUTH RINSE
15.0000 mL | Freq: Two times a day (BID) | OROMUCOSAL | Status: DC
  Administered 2021-10-21 – 2021-10-25 (×7): 15 mL via OROMUCOSAL

## 2021-10-21 MED ORDER — AMLODIPINE BESYLATE 10 MG PO TABS
10.0000 mg | ORAL_TABLET | Freq: Every day | ORAL | Status: DC
  Filled 2021-10-21: qty 1

## 2021-10-21 MED ORDER — TRAMADOL HCL 50 MG PO TABS
50.0000 mg | ORAL_TABLET | Freq: Four times a day (QID) | ORAL | Status: DC | PRN
  Administered 2021-10-21 – 2021-10-24 (×3): 50 mg via ORAL
  Filled 2021-10-21 (×3): qty 1

## 2021-10-21 NOTE — Progress Notes (Signed)
The patient is injury-free, afebrile, alert, and oriented X 4. Pt's Pt was febrile 38.2 C, and tachycardiac 110/min, yellow MEWS protocol activated and the hospitalist on call was notified.  She reports SOB with exertion. Her shoulder pain is controlled with current pain regimen. She denies s/s of chest pain, dizziness, signs or symptoms of bleeding or acute changes during this shift. We will continue to monitor and work toward achieving the care plan goals

## 2021-10-21 NOTE — Assessment & Plan Note (Addendum)
Continues to have low-grade fever. Continues to have bilateral expiratory wheezing.  Suspect this is asthma exacerbation.  Continue Tamiflu.

## 2021-10-21 NOTE — Progress Notes (Signed)
[facility] Progress Note  Patient: Veronica Jimenez    BLT:[id number]  DOA: 10/20/2021    Date of Service: the patient was seen and examined on 10/21/2021  Brief hospital course: No notes on file  Assessment and Plan: * Acute respiratory failure with hypoxia ([place]) Has been having cough and shortness of breath for almost 2 weeks.  Found to have influenza positive on admission. 88% on room air on admission.  On 2 LPM right now. Will wean as possible.  Influenza A Continues to have bilateral expiratory wheezing.  Suspect this is asthma exacerbation.  Continue Tamiflu.  Humeral fracture Primary reason for presentation to ER was fall. Found to have humeral fracture.  Orthopedic was consulted by ER on the phone.  Recommend outpatient follow-up and pain control.  Pancytopenia ([place]) Likely dilutional in nature.  All 3 cell lines are down. Will continue to monitor for now.  Acute renal failure superimposed on stage 3b chronic kidney disease (HCC) Baseline serum creatinine around 1.5.  On presentation serum creatinine around 3.2.  Currently improving with hydration.  We will hold the hydration encourage p.o. hydration and monitor renal function overnight.  Avoid nephrotoxic medication.  AF (paroxysmal atrial fibrillation) (HCC) Not on anticoagulation. Followed by cardiology.   History of ductal carcinoma in situ (DCIS) of breast Followed by [facility] oncology. 14.    Body mass index is 25.33 kg/m.        Subjective: Continues to have pain.  Continues to have shortness of breath and cough.  No nausea no vomiting.  Minimal oral intake.  Objective: Tachypneic and tachycardic  Exam: General: Appear in mild distress, no Rash; Oral Mucosa clear. no Abnormal Neck Mass Or lumps, Conjunctiva normal  Cardiovascular: S1 and S2 Present, no Murmur Respiratory: increased respiratory effort, Bilateral Air entry present and no Crackles, bilateral expiratory wheezes Abdomen: Bowel  Sound present, Soft and no tenderness Extremities: trace Pedal edema Neurology: alert and oriented to time, place, and person affect appropriate. no new focal deficit Gait not checked due to patient safety concerns      Data Reviewed: My review of labs, imaging, notes and other tests is significant for     pancytopenia and improving renal function but not back to baseline  Disposition:  Status is: Inpatient  Remains inpatient appropriate because: Ongoing respiratory distress, pain control issues as well as pancytopenia  Family Communication: None at bedside  DVT Prophylaxis: heparin injection 5,000 Units Start: 10/20/21 2200   Time spent: 35 minutes.   Author: Berle Mull  10/21/2021 7:57 PM  To reach On-call, see care teams to locate the attending and reach out via [email or web address]. Between 7PM-7AM, please contact night-coverage If you still have difficulty reaching the attending provider, please page the [facility] (Director on Call) for [facility] on amion for assistance.

## 2021-10-21 NOTE — Progress Notes (Signed)
Bilateral lower extremity venous duplex completed. Refer to "CV Proc" under chart review to view preliminary results.  10/21/2021 9:33 AM Kelby Aline., MHA, RVT, RDCS, RDMS

## 2021-10-21 NOTE — Assessment & Plan Note (Addendum)
Has been having cough and shortness of breath for almost 2 weeks.   influenza positive on admission. 88% on room air on admission.  On 2 LPM right now. Will wean as possible.

## 2021-10-21 NOTE — Plan of Care (Signed)

## 2021-10-21 NOTE — Assessment & Plan Note (Addendum)
Baseline serum creatinine around 2.2.  On presentation serum creatinine around 3.2. Avoid nephrotoxic medication. Due to worsening will increase fluid rate and perform other test. If continues to worsen will consult Nephrology.

## 2021-10-21 NOTE — Assessment & Plan Note (Addendum)
Hemoglobin stabilized. Likely dilutional in nature.  All 3 cell lines are down. Will continue to monitor for now.

## 2021-10-21 NOTE — Assessment & Plan Note (Signed)
Not on anticoagulation. Followed by cardiology.

## 2021-10-21 NOTE — Assessment & Plan Note (Signed)
Primary reason for presentation to ER was fall. Found to have humeral fracture.  Orthopedic was consulted by ER on the phone.  Recommend outpatient follow-up and pain control.

## 2021-10-21 NOTE — Evaluation (Signed)
Occupational Therapy Evaluation Patient Details Name: Veronica Jimenez MRN: [id number] DOB: 1946-10-31 Today's Date: 10/21/2021   History of Present Illness 75 y.o. 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   Clinical Impression   Patient admitted for the diagnosis above.  Droplet precautions.  PTA she alternated bi-weekly between her daughter and son's home.  Generally she walked household distances with increasing supervision from family, but was still completing ADL on her own, and did try to help with IADL when feeling up to it.  Deficits impacting independence are listed below.  Currently she is needing up to Mod A for bed mobility, up to Max A for ADL completion, and Min A for basic transfers.  OT to follow in the acute setting, but she is hoping to return to her daughter's home first with [facility] OT if needed.       Recommendations for follow up therapy are one component of a multi-disciplinary discharge planning process, led by the attending physician.  Recommendations may be updated based on patient status, additional functional criteria and insurance authorization.   Follow Up Recommendations  Home health OT    Assistance Recommended at Discharge Frequent or constant Supervision/Assistance  Functional Status Assessment  Patient has had a recent decline in their functional status and demonstrates the ability to make significant improvements in function in a reasonable and predictable amount of time.  Equipment Recommendations  BSC/3in1;Tub/shower seat    Recommendations for Other Services       Precautions / Restrictions Precautions Precautions: Shoulder Type of Shoulder Precautions: No A/PROM to R shoulder Shoulder  Interventions: Shoulder sling/immobilizer;Off for dressing/bathing/exercises Precaution Booklet Issued: Yes (comment) Precaution Comments: Reviewed initially Required Braces or Orthoses: Sling Restrictions Weight Bearing Restrictions: Yes RUE Weight Bearing: Non weight bearing  Droplet Precautions.     Mobility Bed Mobility Overal bed mobility: Needs Assistance Bed Mobility: Supine to Sit     Supine to sit: Mod assist          Transfers Overall transfer level: Needs assistance   Transfers: Sit to/from Stand;Bed to chair/wheelchair/BSC Sit to Stand: Min assist     Step pivot transfers: Min assist            Balance Overall balance assessment: Needs assistance Sitting-balance support: Feet supported;Single extremity supported Sitting balance-Leahy Scale: Fair     Standing balance support: Single extremity supported Standing balance-Leahy Scale: Poor Standing balance comment: needs external support                           ADL either performed or assessed with clinical judgement   ADL Overall ADL's : Needs assistance/impaired Eating/Feeding: Set up;Sitting   Grooming: Wash/dry face;Set up;Sitting   Upper Body Bathing: Moderate assistance;Sitting   Lower Body Bathing: Maximal assistance;Sit to/from stand   Upper Body Dressing : Maximal assistance;Sitting   Lower Body Dressing: Moderate assistance;Sit to/from stand   Toilet Transfer: Minimal assistance;Stand-pivot;BSC/3in1   Toileting- Clothing Manipulation and Hygiene: Minimal assistance;Sit to/from stand       Functional mobility during ADLs: Minimal assistance General ADL Comments: HHA     Vision Baseline Vision/History: 1 Wears glasses Patient Visual Report: No change from baseline       Perception Perception Perception: Not tested  Praxis Praxis Praxis: Not tested    Pertinent Vitals/Pain Pain Assessment: Faces Faces Pain Scale: Hurts whole lot Pain Location: R  shoulder Pain Descriptors / Indicators: Sharp;Grimacing;Guarding Pain Intervention(s): Monitored during session     Hand Dominance Right   Extremity/Trunk Assessment Upper Extremity Assessment Upper Extremity Assessment: RUE deficits/detail RUE Deficits / Details: active shoulder fx RUE Sensation: WNL RUE Coordination: WNL   Lower Extremity Assessment Lower Extremity Assessment: Defer to PT evaluation   Cervical / Trunk Assessment Cervical / Trunk Assessment: Kyphotic   Communication Communication Communication: No difficulties   Cognition Arousal/Alertness: Awake/alert Behavior During Therapy: WFL for tasks assessed/performed Overall Cognitive Status: Within Functional Limits for tasks assessed                                       General Comments   VSS on 2 L    Exercises     Shoulder Instructions      Home Living Family/patient expects to be discharged to:: Private residence Living Arrangements: Children;Other (Comment) (Alternates between her daughter and son every two weeks.  Plans on discharging home with daughter first.) Available Help at Discharge: Family;Available 24 hours/day Type of Home: House Home Access: Stairs to enter [employer] of Steps: 2 from the garage Entrance Stairs-Rails: None Home Layout: Two level;Able to live on main level with bedroom/bathroom Alternate Level Stairs-Number of Steps: does not go up the stairs   Bathroom Shower/Tub: Teacher, early years/pre: Standard Bathroom Accessibility: Yes How Accessible: Accessible via walker Home Equipment: None          Prior Functioning/Environment Prior Level of Function : Independent/Modified Independent             Mobility Comments: Declining mobility, household distances with supervision. ADLs Comments: Was still performing her own ADL with family in the home/showers.  Helped with meal prep and home management on good days.  Able to care for  her own meds.        OT Problem List: Decreased range of motion;Decreased activity tolerance;Impaired balance (sitting and/or standing);Pain      OT Treatment/Interventions: Self-care/ADL training;Therapeutic exercise;Therapeutic activities;Patient/family education;Balance training    OT Goals(Current goals can be found in the care plan section) Acute Rehab OT Goals Patient Stated Goal: Hoping to reduce pain and go home OT Goal Formulation: With patient Time For Goal Achievement: 11/04/21 Potential to Achieve Goals: Good ADL Goals Pt Will Perform Upper Body Bathing: sitting;with min guard assist Pt Will Perform Upper Body Dressing: with min assist;sitting Pt Will Perform Lower Body Dressing: with min assist;sit to/from stand Pt Will Transfer to Toilet: with supervision;ambulating Pt Will Perform Toileting - Clothing Manipulation and hygiene: with modified independence;sitting/lateral leans;sit to/from stand Pt Will Perform Tub/Shower Transfer: with min guard assist;ambulating Pt/caregiver will Perform Home Exercise Program: Increased ROM;Right Upper extremity;With written HEP provided  OT Frequency: Min 2X/week   Barriers to D/C:    none noted       Co-evaluation              AM-PAC OT "6 Clicks" Daily Activity     Outcome Measure Help from another person eating meals?: A Little Help from another person taking care of personal grooming?: A Little Help from another person toileting, which includes using toliet, bedpan, or urinal?: A Lot Help from another person bathing (including washing, rinsing, drying)?: A Lot Help from another person to put  on and taking off regular upper body clothing?: A Lot Help from another person to put on and taking off regular lower body clothing?: A Lot 6 Click Score: 14   End of Session Nurse Communication: Mobility status  Activity Tolerance: Patient tolerated treatment well Patient left: in chair;with call bell/phone within reach  OT  Visit Diagnosis: Unsteadiness on feet (R26.81);Pain Pain - Right/Left: Right Pain - part of body: Shoulder                Time: 0383-3383 OT Time Calculation (min): 25 min Charges:  OT General Charges $OT Visit: 1 Visit OT Evaluation $OT Eval Moderate Complexity: 1 Mod OT Treatments $Therapeutic Activity: 8-22 mins  10/21/2021  RP, OTR/L  Acute Rehabilitation Services  Office:  [phone]   Metta Clines 10/21/2021, 5:52 PM

## 2021-10-21 NOTE — Assessment & Plan Note (Addendum)
Followed by [facility] oncology.

## 2021-10-22 ENCOUNTER — Inpatient Hospital Stay (HOSPITAL_COMMUNITY): Payer: Medicare HMO

## 2021-10-22 LAB — COMPREHENSIVE METABOLIC PANEL
ALT: 13 U/L (ref 0–44)
AST: 22 U/L (ref 15–41)
Albumin: 3 g/dL — ABNORMAL LOW (ref 3.5–5.0)
Alkaline Phosphatase: 44 U/L (ref 38–126)
Anion gap: 10 (ref 5–15)
BUN: 41 mg/dL — ABNORMAL HIGH (ref 8–23)
CO2: 16 mmol/L — ABNORMAL LOW (ref 22–32)
Calcium: 8.6 mg/dL — ABNORMAL LOW (ref 8.9–10.3)
Chloride: 106 mmol/L (ref 98–111)
Creatinine, Ser: 3.42 mg/dL — ABNORMAL HIGH (ref 0.44–1.00)
GFR, Estimated: 13 mL/min — ABNORMAL LOW (ref >60)
Glucose, Bld: 126 mg/dL — ABNORMAL HIGH (ref 70–99)
Potassium: 4 mmol/L (ref 3.5–5.1)
Sodium: 132 mmol/L — ABNORMAL LOW (ref 135–145)
Total Bilirubin: 0.4 mg/dL (ref 0.3–1.2)
Total Protein: 6.3 g/dL — ABNORMAL LOW (ref 6.5–8.1)

## 2021-10-22 LAB — LACTIC ACID, PLASMA: Lactic Acid, Venous: 0.4 mmol/L — ABNORMAL LOW (ref 0.5–1.9)

## 2021-10-22 LAB — CBC WITH DIFFERENTIAL/PLATELET
Abs Immature Granulocytes: 0.19 10*3/uL — ABNORMAL HIGH (ref 0.00–0.07)
Basophils Absolute: 0 10*3/uL (ref 0.0–0.1)
Basophils Relative: 0 %
Eosinophils Absolute: 0.1 10*3/uL (ref 0.0–0.5)
Eosinophils Relative: 1 %
HCT: 27.1 % — ABNORMAL LOW (ref 36.0–46.0)
Hemoglobin: 8.6 g/dL — ABNORMAL LOW (ref 12.0–15.0)
Immature Granulocytes: 2 %
Lymphocytes Relative: 12 %
Lymphs Abs: 1.1 10*3/uL (ref 0.7–4.0)
MCH: 30.7 pg (ref 26.0–34.0)
MCHC: 31.7 g/dL (ref 30.0–36.0)
MCV: 96.8 fL (ref 80.0–100.0)
Monocytes Absolute: 1 10*3/uL (ref 0.1–1.0)
Monocytes Relative: 11 %
Neutro Abs: 6.6 10*3/uL (ref 1.7–7.7)
Neutrophils Relative %: 74 %
Platelets: 129 10*3/uL — ABNORMAL LOW (ref 150–400)
RBC: 2.8 MIL/uL — ABNORMAL LOW (ref 3.87–5.11)
RDW: 13.2 % (ref 11.5–15.5)
WBC: 9 10*3/uL (ref 4.0–10.5)
nRBC: 0 % (ref 0.0–0.2)

## 2021-10-22 LAB — CK: Total CK: 43 U/L (ref 38–234)

## 2021-10-22 LAB — OSMOLALITY: Osmolality: 295 mOsm/kg (ref 275–295)

## 2021-10-22 MED ORDER — SODIUM CHLORIDE 0.9 % IV BOLUS
1000.0000 mL | Freq: Once | INTRAVENOUS | Status: AC
  Administered 2021-10-22: 1000 mL via INTRAVENOUS

## 2021-10-22 MED ORDER — AMLODIPINE BESYLATE 5 MG PO TABS
5.0000 mg | ORAL_TABLET | Freq: Every day | ORAL | Status: DC
  Administered 2021-10-23 – 2021-10-25 (×3): 5 mg via ORAL
  Filled 2021-10-22 (×3): qty 1

## 2021-10-22 MED ORDER — AMLODIPINE BESYLATE 10 MG PO TABS
10.0000 mg | ORAL_TABLET | Freq: Every day | ORAL | Status: DC
  Administered 2021-10-22: 10 mg via ORAL
  Filled 2021-10-22: qty 1

## 2021-10-22 NOTE — Progress Notes (Signed)
The patient is injury-free, afebrile, alert, and oriented X 4. She reports SOB with exertion. Her shoulder pain is controlled with current pain regimen. She denies s/s of chest pain, dizziness, signs or symptoms of bleeding or acute changes during this shift. We will continue to monitor and work toward achieving the care plan goals

## 2021-10-22 NOTE — Evaluation (Signed)
Physical Therapy Evaluation Patient Details Name: Veronica Jimenez MRN: [id number] DOB: Jul 25, 1946 Today's Date: 10/22/2021  History of Present Illness  Pt is 75 y.o. female who presented on 10/20/21 s/p fall out of bed with displaced right humeral head and neck fracture being managed non-surgically.  Pt additionally with resp failure secondary to COPD and influenze.  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 At baseline, pt is independent with short community ambulation with distance limited due to resp endurance.  Pt has 24 hr support available at discharge.  Today, pt limited due to weakness/lightheadedness/soft BP.  She did take small steps with min A but unable to go further due to lightheaded/fatigue.  Expected to progress well to home level. Pt currently with functional limitations due to the deficits listed below (see PT Problem List). Pt will benefit from skilled PT to increase their independence and safety with mobility to allow discharge to the venue listed below.       BP in recliner start of session 89/41 with MAP 57 and HR 70 BP after transfer to [facility] 96/61 with MAP 73 and HR 68 BP supine in bed 101/49 with MAP 66 and HR 81   Recommendations for follow up therapy are one component of a multi-disciplinary discharge planning process, led by the attending physician.  Recommendations may be updated based on patient status, additional functional criteria and insurance authorization.  Follow Up Recommendations Home health PT    Assistance Recommended at Discharge Intermittent Supervision/Assistance  Functional Status Assessment Patient has had a recent decline in their functional status and demonstrates the ability to make significant improvements in function in a  reasonable and predictable amount of time.  Equipment Recommendations  Cane    Recommendations for Other Services       Precautions / Restrictions Precautions Precautions: Shoulder Type of Shoulder Precautions: No A/PROM to R shoulder Shoulder Interventions: Shoulder sling/immobilizer;Off for dressing/bathing/exercises Precaution Booklet Issued: Yes (comment) Required Braces or Orthoses: Sling Restrictions RUE Weight Bearing: Non weight bearing      Mobility  Bed Mobility Overal bed mobility: Needs Assistance Bed Mobility: Sit to Supine       Sit to supine: Min assist   General bed mobility comments: Light min A to guard trunk    Transfers Overall transfer level: Needs assistance Equipment used: 1 person hand held assist (HHA or bed rail) Transfers: Sit to/from Stand;Bed to chair/wheelchair/BSC Sit to Stand: Min assist   Step pivot transfers: Min assist       General transfer comment: Sit to stand from recliner using arm rest and min A to rise then small steps to [facility] holding onto armrest; Pt then stood from [facility] with cues to push up and min A and took small steps back to bed with HHA    Ambulation/Gait Ambulation/Gait assistance: Min assist Gait Distance (Feet): 4 Feet Assistive device: 1 person hand held assist Gait Pattern/deviations: Step-to pattern;Decreased stride length Gait velocity: decreased     General Gait Details: Small steps; further ambulation limited due to weakness/lightheadedness/low BP  Stairs            Wheelchair Mobility    Modified Rankin (Stroke Patients Only)       Balance Overall balance assessment: Needs assistance Sitting-balance support: Feet supported Sitting balance-Leahy Scale: Good     Standing  balance support: Single extremity supported Standing balance-Leahy Scale: Poor Standing balance comment: needs external support                             Pertinent Vitals/Pain Pain Assessment:  Faces Faces Pain Scale: Hurts a little bit Pain Location: R shoulder Pain Descriptors / Indicators: Discomfort Pain Intervention(s): Limited activity within patient's tolerance;Monitored during session    Home Living Family/patient expects to be discharged to:: Private residence Living Arrangements: Children;Other (Comment) (Alternates between her daughter and son every two weeks. Plans on discharging home with daughter first) Available Help at Discharge: Family;Available 24 hours/day Type of Home: House Home Access: Stairs to enter Entrance Stairs-Rails: None Entrance Stairs-Number of Steps: 2 from the garage Alternate Level Stairs-Number of Steps: does not go up the stairs Home Layout: Two level;Able to live on main level with bedroom/bathroom Home Equipment: None      Prior Function Prior Level of Function : Independent/Modified Independent             Mobility Comments: Could ambulate short community distances without AD ADLs Comments: Was still performing her own ADL with family in the home/showers.  Helped with meal prep and home management on good days.  Able to care for her own meds.     Hand Dominance   Dominant Hand: Right    Extremity/Trunk Assessment   Upper Extremity Assessment RUE Deficits / Details: R UE in sling - not tested    Lower Extremity Assessment Lower Extremity Assessment: LLE deficits/detail;RLE deficits/detail RLE Deficits / Details: ROM WFL; MMT 5/5 LLE Deficits / Details: ROM WFL; MMT 5/5    Cervical / Trunk Assessment Cervical / Trunk Assessment: Kyphotic  Communication   Communication: No difficulties  Cognition Arousal/Alertness: Lethargic Behavior During Therapy: WFL for tasks assessed/performed Overall Cognitive Status: Within Functional Limits for tasks assessed                                          General Comments      Exercises     Assessment/Plan    PT Assessment Patient needs continued PT  services  PT Problem List Decreased strength;Decreased mobility;Decreased range of motion;Decreased activity tolerance;Cardiopulmonary status limiting activity;Decreased balance;Decreased knowledge of use of DME;Pain       PT Treatment Interventions DME instruction;Therapeutic activities;Gait training;Therapeutic exercise;Patient/family education;Balance training;Functional mobility training;Modalities    PT Goals (Current goals can be found in the Care Plan section)  Acute Rehab PT Goals Patient Stated Goal: feel better; return home PT Goal Formulation: With patient Time For Goal Achievement: 11/05/21 Potential to Achieve Goals: Good    Frequency Min 3X/week   Barriers to discharge        Co-evaluation               AM-PAC PT "6 Clicks" Mobility  Outcome Measure Help needed turning from your back to your side while in a flat bed without using bedrails?: A Little Help needed moving from lying on your back to sitting on the side of a flat bed without using bedrails?: A Little Help needed moving to and from a bed to a chair (including a wheelchair)?: A Little Help needed standing up from a chair using your arms (e.g., wheelchair or bedside chair)?: A Little Help needed to walk in hospital room?: A Little Help needed climbing 3-5 steps  with a railing? : A Lot 6 Click Score: 17    End of Session Equipment Utilized During Treatment: Gait belt Activity Tolerance: Treatment limited secondary to medical complications (Comment) (lightheaded/low BP) Patient left: in bed;with call bell/phone within reach;with bed alarm set Nurse Communication: Mobility status (low BP) PT Visit Diagnosis: Other abnormalities of gait and mobility (R26.89);Muscle weakness (generalized) (M62.81)    Time: 7654-6503 PT Time Calculation (min) (ACUTE ONLY): 25 min   Charges:   PT Evaluation $PT Eval Moderate Complexity: 1 Mod PT Treatments $Therapeutic Activity: 8-22 mins        Abran Richard,  PT Acute Rehab Services Pager [phone] [facility] [place] 10/22/2021, 1:57 PM

## 2021-10-22 NOTE — Progress Notes (Signed)
[facility] Progress Note  Patient: Veronica Jimenez    SVX:[id number]  DOA: 10/20/2021    Date of Service: the patient was seen and examined on 10/22/2021  Brief hospital course: No notes on file  Assessment and Plan: * Acute respiratory failure with hypoxia ([place]) Has been having cough and shortness of breath for almost 2 weeks.   influenza positive on admission. 88% on room air on admission.  On 2 LPM right now. Will wean as possible.  Influenza A Continues to have low-grade fever. Continues to have bilateral expiratory wheezing.  Suspect this is asthma exacerbation.  Continue Tamiflu.  Humeral fracture Primary reason for presentation to ER was fall. Found to have humeral fracture.  Orthopedic was consulted by ER on the phone.  Recommend outpatient follow-up and pain control.  Pancytopenia (HCC) Hemoglobin stabilized. Likely dilutional in nature.  All 3 cell lines are down. Will continue to monitor for now.  Acute renal failure superimposed on stage 4 chronic kidney disease (HCC) Baseline serum creatinine around 2.2.  On presentation serum creatinine around 3.2. Avoid nephrotoxic medication. Due to worsening will increase fluid rate and perform other test. If continues to worsen will consult Nephrology.   AF (paroxysmal atrial fibrillation) (HCC) Not on anticoagulation. Followed by cardiology.   History of ductal carcinoma in situ (DCIS) of breast Followed by [facility] oncology.    Body mass index is 25.33 kg/m.        Subjective: Pain medication makes her sleepy.  No nausea no vomiting.  No fever no chills.  Continues to have cough and shortness of breath.  No chest pain.  No abdominal pain.  No diarrhea no constipation.  Objective: Tachycardic.  Hypotensive in the morning.  Tachypneic and had low-grade fever.  Exam: General: Appear in mild distress, no Rash; Oral Mucosa Clear, moist. no Abnormal Neck Mass Or lumps, Conjunctiva normal  Cardiovascular:  S1 and S2 Present, no Murmur, Respiratory: increased respiratory effort, Bilateral Air entry present and bilateral  Crackles, no wheezes Abdomen: Bowel Sound present, Soft and no tenderness Extremities: trace Pedal edema Neurology: alert and oriented to time, place, and person affect appropriate. no new focal deficit Gait not checked due to patient safety concerns    Data Reviewed: My review of labs, imaging, notes and other tests is significant for Stable hemoglobin and electrolytes    Disposition:  Status is: Inpatient  Remains inpatient appropriate because: Ongoing low-grade fever, hypotension, worsening renal function requiring further work-up.   Family Communication: None at bedside.  DVT Prophylaxis: heparin injection 5,000 Units Start: 10/20/21 2200   Time spent: 35 minutes.   Author: Berle Mull  10/22/2021 5:30 PM  To reach On-call, see care teams to locate the attending and reach out via [email or web address]. Between 7PM-7AM, please contact night-coverage If you still have difficulty reaching the attending provider, please page the [facility] (Director on Call) for [facility] on amion for assistance.

## 2021-10-22 NOTE — Progress Notes (Signed)
Occupational Therapy Treatment Patient Details Name: Veronica Jimenez MRN: [id number] DOB: July 15, 1946 Today's Date: 10/22/2021   History of present illness Pt is 75 y.o. female who presented on 10/20/21 s/p fall out of bed with displaced right humeral head and neck fracture being managed non-surgically.  Pt additionally with resp failure secondary to COPD and influenze.  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  Initial fist pumps, wrist and forearm performed with sling on, and elbow flex/ext was performed with the neck strap off only.  Patient did well with fist, wrist and forearm achieving full range, elbow flex/ext with the hand moving against the body to avoid rotation was approximately 20 degrees.  OT wil continue efforts in the acute setting, at some point family training for ADL completion and HEP will be needed.  [place] OT is recommended with 24 hour assist as needed from family at home.     Recommendations for follow up therapy are one component of a multi-disciplinary discharge planning process, led by the attending physician.  Recommendations may be updated based on patient status, additional functional criteria and insurance authorization.    Follow Up Recommendations  Home health OT    Assistance Recommended at Discharge Frequent or constant Supervision/Assistance  Equipment Recommendations  BSC/3in1;Tub/shower seat    Recommendations for Other Services      Precautions / Restrictions Precautions Precautions: Shoulder Type of Shoulder Precautions: No A/PROM to R shoulder Shoulder Interventions: Shoulder sling/immobilizer;Off for dressing/bathing/exercises Precaution Booklet Issued: Yes  (comment) Required Braces or Orthoses: Sling Restrictions Weight Bearing Restrictions: Yes RUE Weight Bearing: Non weight bearing       Mobility Bed Mobility Overal bed mobility: Needs Assistance Bed Mobility: Supine to Sit;Sit to Supine     Supine to sit: Mod assist Sit to supine: Min assist   General bed mobility comments: Light min A to guard trunk    Transfers Overall transfer level: Needs assistance Equipment used: 1 person hand held assist (HHA or bed rail) Transfers: Sit to/from Stand;Bed to chair/wheelchair/BSC Sit to Stand: Min assist   Step pivot transfers: Min assist       General transfer comment: Sit to stand from recliner using arm rest and min A to rise then small steps to [facility] holding onto armrest; Pt then stood from [facility] with cues to push up and min A and took small steps back to bed with HHA     Balance Overall balance assessment: Needs assistance Sitting-balance support: Feet supported Sitting balance-Leahy Scale: Good     Standing balance support: Single extremity supported Standing balance-Leahy Scale: Poor Standing balance comment: needs external support                           ADL either performed or assessed with clinical judgement   ADL                                              Extremity/Trunk Assessment Upper Extremity Assessment RUE Deficits / Details: R UE in sling - not tested   Lower Extremity Assessment Lower Extremity Assessment:  LLE deficits/detail;RLE deficits/detail RLE Deficits / Details: ROM WFL; MMT 5/5 LLE Deficits / Details: ROM WFL; MMT 5/5   Cervical / Trunk Assessment Cervical / Trunk Assessment: Kyphotic    Vision       Perception     Praxis      Cognition Arousal/Alertness: Lethargic;Suspect due to medications Behavior During Therapy: [facility] for tasks assessed/performed Overall Cognitive Status: Within Functional Limits for tasks assessed                                             Exercises General Exercises - Upper Extremity Elbow Flexion: AAROM;Right;5 reps Elbow Extension: AAROM;Right;5 reps Wrist Flexion: AROM;Right;10 reps Wrist Extension: AROM;Right;10 reps Digit Composite Flexion: AROM;Right;10 reps Composite Extension: AROM;Right;10 reps Other Exercises Other Exercises: forearm sup/pronation 1 set and 10 reps AA   Shoulder Instructions       General Comments      Pertinent Vitals/ Pain       Pain Assessment: Faces Faces Pain Scale: Hurts little more Pain Location: R shoulder Pain Descriptors / Indicators: Discomfort Pain Intervention(s): Monitored during session                                                          Frequency  Min 2X/week        Progress Toward Goals  OT Goals(current goals can now be found in the care plan section)  Progress towards OT goals: Progressing toward goals  Acute Rehab OT Goals OT Goal Formulation: With patient Time For Goal Achievement: 11/04/21 Potential to Achieve Goals: Good  Plan Discharge plan remains appropriate    Co-evaluation                 AM-PAC OT "6 Clicks" Daily Activity     Outcome Measure   Help from another person eating meals?: A Little Help from another person taking care of personal grooming?: A Little Help from another person toileting, which includes using toliet, bedpan, or urinal?: A Lot Help from another person bathing (including washing, rinsing, drying)?: A Lot Help from another person to put on and taking off regular upper body clothing?: A Lot Help from another person to put on and taking off regular lower body clothing?: A Lot 6 Click Score: 14    End of Session    OT Visit Diagnosis: Unsteadiness on feet (R26.81);Pain Pain - Right/Left: Right Pain - part of body: Shoulder   Activity Tolerance Patient tolerated treatment well   Patient Left in bed;with call bell/phone within reach   Nurse  Communication Mobility status        Time: 2951-8841 OT Time Calculation (min): 17 min  Charges: OT General Charges $OT Visit: 1 Visit OT Treatments $Self Care/Home Management : 8-22 mins  10/22/2021  RP, OTR/L  Acute Rehabilitation Services  Office:  [phone]   Metta Clines 10/22/2021, 5:11 PM

## 2021-10-22 NOTE — Plan of Care (Signed)

## 2021-10-23 ENCOUNTER — Inpatient Hospital Stay (HOSPITAL_COMMUNITY): Payer: Medicare HMO

## 2021-10-23 DIAGNOSIS — I10 Essential (primary) hypertension: Secondary | ICD-10-CM | POA: Diagnosis present

## 2021-10-23 LAB — CBC WITH DIFFERENTIAL/PLATELET
Abs Immature Granulocytes: 0.1 10*3/uL — ABNORMAL HIGH (ref 0.00–0.07)
Basophils Absolute: 0 10*3/uL (ref 0.0–0.1)
Basophils Relative: 0 %
Eosinophils Absolute: 0.2 10*3/uL (ref 0.0–0.5)
Eosinophils Relative: 3 %
HCT: 26.1 % — ABNORMAL LOW (ref 36.0–46.0)
Hemoglobin: 8.1 g/dL — ABNORMAL LOW (ref 12.0–15.0)
Immature Granulocytes: 1 %
Lymphocytes Relative: 20 %
Lymphs Abs: 1.4 10*3/uL (ref 0.7–4.0)
MCH: 30.8 pg (ref 26.0–34.0)
MCHC: 31 g/dL (ref 30.0–36.0)
MCV: 99.2 fL (ref 80.0–100.0)
Monocytes Absolute: 0.6 10*3/uL (ref 0.1–1.0)
Monocytes Relative: 8 %
Neutro Abs: 4.9 10*3/uL (ref 1.7–7.7)
Neutrophils Relative %: 68 %
Platelets: 120 10*3/uL — ABNORMAL LOW (ref 150–400)
RBC: 2.63 MIL/uL — ABNORMAL LOW (ref 3.87–5.11)
RDW: 13.2 % (ref 11.5–15.5)
WBC: 7.3 10*3/uL (ref 4.0–10.5)
nRBC: 0 % (ref 0.0–0.2)

## 2021-10-23 LAB — RENAL FUNCTION PANEL
Albumin: 2.9 g/dL — ABNORMAL LOW (ref 3.5–5.0)
Anion gap: 8 (ref 5–15)
BUN: 35 mg/dL — ABNORMAL HIGH (ref 8–23)
CO2: 16 mmol/L — ABNORMAL LOW (ref 22–32)
Calcium: 8.7 mg/dL — ABNORMAL LOW (ref 8.9–10.3)
Chloride: 112 mmol/L — ABNORMAL HIGH (ref 98–111)
Creatinine, Ser: 3.35 mg/dL — ABNORMAL HIGH (ref 0.44–1.00)
GFR, Estimated: 14 mL/min — ABNORMAL LOW (ref >60)
Glucose, Bld: 108 mg/dL — ABNORMAL HIGH (ref 70–99)
Phosphorus: 3.5 mg/dL (ref 2.5–4.6)
Potassium: 4 mmol/L (ref 3.5–5.1)
Sodium: 136 mmol/L (ref 135–145)

## 2021-10-23 LAB — NA AND K (SODIUM & POTASSIUM), RAND UR
Potassium Urine: 21 mmol/L
Sodium, Ur: 45 mmol/L

## 2021-10-23 LAB — CREATININE, URINE, RANDOM: Creatinine, Urine: 91.07 mg/dL

## 2021-10-23 MED ORDER — IPRATROPIUM-ALBUTEROL 0.5-2.5 (3) MG/3ML IN SOLN
3.0000 mL | Freq: Four times a day (QID) | RESPIRATORY_TRACT | Status: DC
  Administered 2021-10-23 – 2021-10-25 (×7): 3 mL via RESPIRATORY_TRACT
  Filled 2021-10-23 (×8): qty 3

## 2021-10-23 MED ORDER — BENZONATATE 100 MG PO CAPS
100.0000 mg | ORAL_CAPSULE | Freq: Three times a day (TID) | ORAL | Status: DC
  Administered 2021-10-23 – 2021-10-25 (×8): 100 mg via ORAL
  Filled 2021-10-23 (×8): qty 1

## 2021-10-23 MED ORDER — DM-GUAIFENESIN ER 30-600 MG PO TB12
1.0000 | ORAL_TABLET | Freq: Two times a day (BID) | ORAL | Status: DC
Start: 2021-10-23 — End: 2021-10-25
  Administered 2021-10-23 – 2021-10-25 (×5): 1 via ORAL
  Filled 2021-10-23 (×5): qty 1

## 2021-10-23 NOTE — Assessment & Plan Note (Addendum)
Fever curve improving. Continues to have bilateral expiratory wheezing.  Continue Tamiflu. Repeat chest x-ray 12/3 shows vascular congestion. Continue cough suppressant, added nebulizer therapy, flutter valve as well. Add prednisone.

## 2021-10-23 NOTE — Assessment & Plan Note (Addendum)
Baseline hemoglobin around 12.   On presentation hemoglobin 9.6.  Currently stable around 8. Platelet count also low.  WBC also low.  This is all dilutional in nature.

## 2021-10-23 NOTE — Assessment & Plan Note (Addendum)
Has been having cough and shortness of breath for almost 2 weeks.   influenza positive on admission. 88% on room air on still.  On 2 LPM right now.  Also has tachycardia. Chest x-ray shows worsening vascular congestion.

## 2021-10-23 NOTE — Progress Notes (Signed)
[facility] Progress Note  Patient: Veronica Jimenez    NOM:[id number]  DOA: 10/20/2021    Date of Service: the patient was seen and examined on 10/23/2021  Brief hospital course: No notes on file  Assessment and Plan: * Acute respiratory failure with hypoxia ([place]) Has been having cough and shortness of breath for almost 2 weeks.   influenza positive on admission. 88% on room air on admission.  On 2 LPM right now. Chest x-ray shows worsening vascular congestion.  Influenza A Fever curve improving. Continues to have bilateral expiratory wheezing.  Continue Tamiflu. Repeat chest x-ray 12/3 shows vascular congestion. Continue cough suppressant, added nebulizer therapy, flutter valve as well.  Essential hypertension Blood pressure fluctuating significantly.  Early in the morning on 12/2 blood pressure was 962 systolic.  Later in the day with physical therapy blood pressure was in 83M systolic. Currently on Norvasc, hydralazine, metoprolol which are Her home medication. Reduce the dose of the hydralazine.   Humeral fracture Primary reason for presentation to ER was fall. Found to have humeral fracture.  Orthopedic was consulted by ER on the phone.  Recommend outpatient follow-up and pain control.  Pancytopenia (HCC) Baseline hemoglobin around 12.  On presentation hemoglobin 9.6.  Currently stable around 8. Platelet count also low.  WBC also low.  This is all dilutional in nature.  Acute renal failure superimposed on stage 4 chronic kidney disease (HCC) Baseline serum creatinine around 2.2 8/22 in Care Everywhere.  On presentation serum creatinine around 3.2. Ultrasound renal negative for any obstruction. Making enough urine.  Ins and outs are not accurate. FENa 1.3 %.  Showing intrinsic etiology. Avoid nephrotoxic medication. Due to vascular congestion I will be holding IV fluids and monitor.  AF (paroxysmal atrial fibrillation) (HCC) Not on anticoagulation. Followed  by cardiology.   History of ductal carcinoma in situ (DCIS) of breast Followed by [facility] oncology.    Body mass index is 25.33 kg/m.        Subjective: Continues to have cough and shortness of breath.  Increasing fatigue.  No nausea no vomiting.  Minimal oral intake.  Only ambulated 4 feet with physical therapy  Objective: Vital signs were reviewed and unremarkable.  Exam: General: Appear in mild distress, no Rash; Oral Mucosa Clear, moist. no Abnormal Neck Mass Or lumps, Conjunctiva normal  Cardiovascular: S1 and S2 Present, no Murmur, Respiratory: increased respiratory effort, Bilateral Air entry present and bilateral  Crackles, no wheezes Abdomen: Bowel Sound present, Soft and no tenderness Extremities: trace Pedal edema Neurology: alert and oriented to time, place, and person affect appropriate. no new focal deficit Gait not checked due to patient safety concerns    Data Reviewed: My review of labs, imaging, notes and other tests is significant for     chest x-ray showing vascular congestion  Disposition:  Status is: Inpatient  Remains inpatient appropriate because: Ongoing shortness of breath and respiratory distress.  Worsening renal function from baseline requiring close observation after stopping IV fluid.   Family Communication: None at bedside, will discussed with son on the phone.  DVT Prophylaxis: heparin injection 5,000 Units Start: 10/20/21 2200   Time spent: 35 minutes.   Author: Berle Mull  10/23/2021 2:03 PM  To reach On-call, see care teams to locate the attending and reach out via [email or web address]. Between 7PM-7AM, please contact night-coverage If you still have difficulty reaching the attending provider, please page the [facility] (Director on Call) for [facility] on amion for assistance.

## 2021-10-23 NOTE — Assessment & Plan Note (Signed)
Followed by [facility] oncology.

## 2021-10-23 NOTE — Assessment & Plan Note (Addendum)
Primary reason for presentation to ER was fall. Found to have humeral fracture. Orthopedic was consulted by ER on the phone.   Recommend outpatient follow-up and pain control.  Outpatient follow-up recommended.

## 2021-10-23 NOTE — Assessment & Plan Note (Addendum)
Baseline serum creatinine around 2.2 8/22 in Care Everywhere.  On presentation serum creatinine around 3.2. Ultrasound renal negative for any obstruction. Making enough urine.  Ins and outs are not accurate. FENa 1.3 %.  Showing intrinsic etiology. Avoid nephrotoxic medication. Due to vascular congestion I will be holding IV fluids and monitor. Renal function is improving now close to normal.

## 2021-10-23 NOTE — Assessment & Plan Note (Signed)
Not on anticoagulation. Followed by cardiology.

## 2021-10-23 NOTE — Assessment & Plan Note (Signed)
Blood pressure fluctuating significantly.  Early in the morning on 12/2 blood pressure was 006 systolic.  Later in the day with physical therapy blood pressure was in 34Z systolic. Currently on Norvasc, hydralazine, metoprolol which are Her home medication. Reduce the dose of the hydralazine.

## 2021-10-24 LAB — RENAL FUNCTION PANEL
Albumin: 3 g/dL — ABNORMAL LOW (ref 3.5–5.0)
Anion gap: 5 (ref 5–15)
BUN: 30 mg/dL — ABNORMAL HIGH (ref 8–23)
CO2: 15 mmol/L — ABNORMAL LOW (ref 22–32)
Calcium: 9.4 mg/dL (ref 8.9–10.3)
Chloride: 115 mmol/L — ABNORMAL HIGH (ref 98–111)
Creatinine, Ser: 2.75 mg/dL — ABNORMAL HIGH (ref 0.44–1.00)
GFR, Estimated: 17 mL/min — ABNORMAL LOW (ref >60)
Glucose, Bld: 135 mg/dL — ABNORMAL HIGH (ref 70–99)
Phosphorus: 2.7 mg/dL (ref 2.5–4.6)
Potassium: 3.5 mmol/L (ref 3.5–5.1)
Sodium: 135 mmol/L (ref 135–145)

## 2021-10-24 LAB — CBC WITH DIFFERENTIAL/PLATELET
Abs Immature Granulocytes: 0.09 10*3/uL — ABNORMAL HIGH (ref 0.00–0.07)
Basophils Absolute: 0 10*3/uL (ref 0.0–0.1)
Basophils Relative: 0 %
Eosinophils Absolute: 0.1 10*3/uL (ref 0.0–0.5)
Eosinophils Relative: 1 %
HCT: 26.1 % — ABNORMAL LOW (ref 36.0–46.0)
Hemoglobin: 8.6 g/dL — ABNORMAL LOW (ref 12.0–15.0)
Immature Granulocytes: 1 %
Lymphocytes Relative: 10 %
Lymphs Abs: 1 10*3/uL (ref 0.7–4.0)
MCH: 31 pg (ref 26.0–34.0)
MCHC: 33 g/dL (ref 30.0–36.0)
MCV: 94.2 fL (ref 80.0–100.0)
Monocytes Absolute: 0.5 10*3/uL (ref 0.1–1.0)
Monocytes Relative: 5 %
Neutro Abs: 8.1 10*3/uL — ABNORMAL HIGH (ref 1.7–7.7)
Neutrophils Relative %: 83 %
Platelets: 137 10*3/uL — ABNORMAL LOW (ref 150–400)
RBC: 2.77 MIL/uL — ABNORMAL LOW (ref 3.87–5.11)
RDW: 13 % (ref 11.5–15.5)
WBC: 9.8 10*3/uL (ref 4.0–10.5)
nRBC: 0 % (ref 0.0–0.2)

## 2021-10-24 MED ORDER — PREDNISONE 10 MG PO TABS: 10.0000 mg | ORAL_TABLET | Freq: Every day | ORAL | Status: DC

## 2021-10-24 MED ORDER — PREDNISONE 20 MG PO TABS: 20.0000 mg | ORAL_TABLET | Freq: Every day | ORAL | Status: DC

## 2021-10-24 MED ORDER — ONDANSETRON HCL 4 MG/2ML IJ SOLN
4.0000 mg | Freq: Three times a day (TID) | INTRAMUSCULAR | Status: DC | PRN
  Administered 2021-10-24: 07:00:00 4 mg via INTRAVENOUS
  Filled 2021-10-24: qty 2

## 2021-10-24 MED ORDER — PREDNISONE 20 MG PO TABS
30.0000 mg | ORAL_TABLET | Freq: Every day | ORAL | Status: AC
  Administered 2021-10-25: 30 mg via ORAL
  Filled 2021-10-24: qty 1

## 2021-10-24 MED ORDER — PREDNISONE 20 MG PO TABS
40.0000 mg | ORAL_TABLET | Freq: Every day | ORAL | Status: AC
  Administered 2021-10-24: 15:00:00 40 mg via ORAL
  Filled 2021-10-24: qty 2

## 2021-10-24 NOTE — Progress Notes (Signed)
[facility] Progress Note  Patient: Veronica Jimenez    PTW:[id number]  DOA: 10/20/2021    Date of Service: the patient was seen and examined on 10/24/2021  Brief hospital course: 11/30 presented with a fall most likely secondary to hypoxia from influenza. 12/4 started on prednisone.  Assessment and Plan: * Acute respiratory failure with hypoxia ([place]) Has been having cough and shortness of breath for almost 2 weeks.   influenza positive on admission. 88% on room air on still.  On 2 LPM right now.  Also has tachycardia. Chest x-ray shows worsening vascular congestion.  Influenza A Fever curve improving. Continues to have bilateral expiratory wheezing.  Continue Tamiflu. Repeat chest x-ray 12/3 shows vascular congestion. Continue cough suppressant, added nebulizer therapy, flutter valve as well. Add prednisone.  Essential hypertension Blood pressure fluctuating significantly.  Early in the morning on 12/2 blood pressure was 700 systolic.  Later in the day with physical therapy blood pressure was in 17C systolic. Currently on Norvasc, hydralazine, metoprolol which are Her home medication. Reduce the dose of the hydralazine.   Humeral fracture Primary reason for presentation to ER was fall. Found to have humeral fracture. Orthopedic was consulted by ER on the phone.   Recommend outpatient follow-up and pain control.  Outpatient follow-up recommended.  Pancytopenia (HCC) Baseline hemoglobin around 12.   On presentation hemoglobin 9.6.  Currently stable around 8. Platelet count also low.  WBC also low.  This is all dilutional in nature.  Acute renal failure superimposed on stage 4 chronic kidney disease (HCC) Baseline serum creatinine around 2.2 8/22 in Care Everywhere.  On presentation serum creatinine around 3.2. Ultrasound renal negative for any obstruction. Making enough urine.  Ins and outs are not accurate. FENa 1.3 %.  Showing intrinsic etiology. Avoid  nephrotoxic medication. Due to vascular congestion I will be holding IV fluids and monitor. Renal function is improving now close to normal.  AF (paroxysmal atrial fibrillation) (HCC) Not on anticoagulation. Followed by cardiology.   History of ductal carcinoma in situ (DCIS) of breast Followed by [facility] oncology.   Subjective: Continues to have cough and shortness of breath.  No nausea or vomiting but no fever no chills.  No shoulder pain reported.  Objective: Tachypnea and tachycardia present.  88% on room air.  Exam: General: Appear in mild distress, no Rash; Oral Mucosa clear. no Abnormal Neck Mass Or lumps, Conjunctiva normal  Cardiovascular: S1 and S2 Present, no Murmur Respiratory: increased respiratory effort, Bilateral Air entry present and no Crackles, bilateral expiratory wheezes Abdomen: Bowel Sound present, Soft and no tenderness Extremities: trace Pedal edema Neurology: alert and oriented to time, place, and person affect appropriate. no new focal deficit Gait not checked due to patient safety concerns      Data Reviewed: My review of labs, imaging, notes and other tests shows no new significant findings.   Disposition:  Status is: Inpatient  Remains inpatient appropriate because: Continues to have hypoxia.  Severe respiratory distress.  Family Communication: None at bedside.  DVT Prophylaxis: heparin injection 5,000 Units Start: 10/20/21 2200   Time spent: 35 minutes.   Author: Berle Mull  10/24/2021 4:51 PM  To reach On-call, see care teams to locate the attending and reach out via [email or web address]. Between 7PM-7AM, please contact night-coverage If you still have difficulty reaching the attending provider, please page the [facility] (Director on Call) for [facility] on amion for assistance.

## 2021-10-24 NOTE — Hospital Course (Signed)
11/30 presented with a fall most likely secondary to hypoxia from influenza. 12/4 started on prednisone.

## 2021-10-25 LAB — CULTURE, BLOOD (ROUTINE X 2)
Culture: NO GROWTH
Special Requests: ADEQUATE

## 2021-10-25 LAB — RENAL FUNCTION PANEL
Albumin: 3.2 g/dL — ABNORMAL LOW (ref 3.5–5.0)
Anion gap: 6 (ref 5–15)
BUN: 28 mg/dL — ABNORMAL HIGH (ref 8–23)
CO2: 16 mmol/L — ABNORMAL LOW (ref 22–32)
Calcium: 10.3 mg/dL (ref 8.9–10.3)
Chloride: 111 mmol/L (ref 98–111)
Creatinine, Ser: 2.48 mg/dL — ABNORMAL HIGH (ref 0.44–1.00)
GFR, Estimated: 20 mL/min — ABNORMAL LOW (ref >60)
Glucose, Bld: 187 mg/dL — ABNORMAL HIGH (ref 70–99)
Phosphorus: 2.8 mg/dL (ref 2.5–4.6)
Potassium: 3.5 mmol/L (ref 3.5–5.1)
Sodium: 133 mmol/L — ABNORMAL LOW (ref 135–145)

## 2021-10-25 MED ORDER — IPRATROPIUM-ALBUTEROL 0.5-2.5 (3) MG/3ML IN SOLN: 3.0000 mL | Freq: Three times a day (TID) | RESPIRATORY_TRACT | Status: DC

## 2021-10-25 MED ORDER — AMLODIPINE BESYLATE 5 MG PO TABS: 5.0000 mg | ORAL_TABLET | Freq: Every day | ORAL | 0 refills | Status: DC

## 2021-10-25 MED ORDER — BENZONATATE 100 MG PO CAPS: 100.0000 mg | ORAL_CAPSULE | Freq: Three times a day (TID) | ORAL | 0 refills | Status: AC

## 2021-10-25 MED ORDER — DM-GUAIFENESIN ER 30-600 MG PO TB12: 1.0000 | ORAL_TABLET | Freq: Two times a day (BID) | ORAL | 0 refills | Status: AC

## 2021-10-25 MED ORDER — IPRATROPIUM-ALBUTEROL 0.5-2.5 (3) MG/3ML IN SOLN: 3.0000 mL | Freq: Three times a day (TID) | RESPIRATORY_TRACT | 0 refills | Status: AC

## 2021-10-25 NOTE — Progress Notes (Signed)
Went over discharge instructions w/ pt. Pt verbalized understanding and is aware the nebulizer machine has been ordered for home delivery.

## 2021-10-25 NOTE — Progress Notes (Signed)
Physical Therapy Treatment Patient Details Name: Veronica Jimenez MRN: [id number] DOB: Aug 01, 1946 Today's Date: 10/25/2021   History of Present Illness Pt is 75 y.o. female who presented on 10/20/21 s/p fall out of bed with displaced right humeral head and neck fracture being managed non-surgically.  Pt additionally with resp failure secondary to COPD and influenze.  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  She required light min A for bed mobility but then min guard-supervision for OOB activity and walked 120' without AD.   VSS on RA (sats 93% rest, 95% walk).  Pt did ambulate at slower speed but without LOB and no lightheadedness/dizziness today.  Pt has support at home.  Cont POC while hospitalized.    Recommendations for follow up therapy are one component of a multi-disciplinary discharge planning process, led by the attending physician.  Recommendations may be updated based on patient status, additional functional criteria and insurance authorization.  Follow Up Recommendations  Home health PT     Assistance Recommended at Discharge Intermittent Supervision/Assistance  Equipment Recommendations  None recommended by PT    Recommendations for Other Services       Precautions / Restrictions Precautions Precautions: Shoulder Type of Shoulder Precautions: No A/PROM to R shoulder Shoulder Interventions: Shoulder sling/immobilizer;Off for dressing/bathing/exercises Required Braces or Orthoses: Sling Restrictions Weight Bearing Restrictions: Yes RUE Weight Bearing: Non weight bearing     Mobility  Bed Mobility Overal bed mobility: Needs Assistance Bed Mobility: Supine to Sit     Supine to sit: Min assist     General bed mobility comments: Light min A to guard  trunk    Transfers Overall transfer level: Needs assistance Equipment used: None Transfers: Sit to/from Stand Sit to Stand: Supervision           General transfer comment: Close supervision; had cane but pt wanted to try without    Ambulation/Gait Ambulation/Gait assistance: Min guard;Supervision Gait Distance (Feet): 120 Feet Assistive device: None Gait Pattern/deviations: Step-through pattern;Decreased stride length Gait velocity: decreased     General Gait Details: Min guard progressed to supervision; no AD used; Pt ambulated slowly in room but steady; pt navigating around objects and turning safely   Stairs             Wheelchair Mobility    Modified Rankin (Stroke Patients Only)       Balance Overall balance assessment: Needs assistance Sitting-balance support: Feet supported Sitting balance-Leahy Scale: Good     Standing balance support: No upper extremity supported Standing balance-Leahy Scale: Good Standing balance comment: Ambulated without support but not further challenged                            Cognition Arousal/Alertness: Awake/alert Behavior During Therapy: WFL for tasks assessed/performed Overall Cognitive Status: Within Functional Limits for tasks assessed                                          Exercises      General Comments General comments (skin integrity, edema, etc.): Pt on RA with sats 93% rest and up to 95% walking.  Educated on could remove sling to dress  but stabilize arm.  Dressing (button up easiest) but recommended putting in R arm first and taking off last. Also discussed gradual increase in endurance with several short walks throughout day, encouraged incentive and flutter valve use.      Pertinent Vitals/Pain Pain Assessment: Faces Faces Pain Scale: Hurts little more Pain Location: R shoulder Pain Descriptors / Indicators: Discomfort Pain Intervention(s): Monitored during  session;Limited activity within patient's tolerance    Home Living                          Prior Function            PT Goals (current goals can now be found in the care plan section) Progress towards PT goals: Progressing toward goals    Frequency    Min 3X/week      PT Plan Current plan remains appropriate    Co-evaluation              AM-PAC PT "6 Clicks" Mobility   Outcome Measure  Help needed turning from your back to your side while in a flat bed without using bedrails?: A Little Help needed moving from lying on your back to sitting on the side of a flat bed without using bedrails?: A Little Help needed moving to and from a bed to a chair (including a wheelchair)?: A Little Help needed standing up from a chair using your arms (e.g., wheelchair or bedside chair)?: A Little Help needed to walk in hospital room?: A Little Help needed climbing 3-5 steps with a railing? : A Little 6 Click Score: 18    End of Session Equipment Utilized During Treatment: Gait belt Activity Tolerance: Patient tolerated treatment well Patient left: in chair;with call bell/phone within reach Nurse Communication: Mobility status PT Visit Diagnosis: Other abnormalities of gait and mobility (R26.89);Muscle weakness (generalized) (M62.81)     Time: 1243-1300 PT Time Calculation (min) (ACUTE ONLY): 17 min  Charges:  $Gait Training: 8-22 mins                     Abran Richard, PT Acute Rehab Services Pager [phone] [facility] [place] 10/25/2021, 1:13 PM

## 2021-10-25 NOTE — TOC Transition Note (Addendum)
Transition of Care [facility]) - CM/SW Discharge Note   Patient Details  Name: Mertha Clyatt MRN: [id number] Date of Birth: 09/13/1946  Transition of Care [facility]) CM/SW Contact:  Trish Mage, LCSW Phone Number: 10/25/2021, 2:00 PM   Clinical Narrative:   Patient who lives with her daughter and 2 granddaughters in [place] is stable for d/c, in need of [facility] PT.  Ms Encalada is agreeable to services.  Contacted Cindie with [facility] who confirmed ability to provide [facility] services. She indicated she has had a hard time picking up her meds recently as she no longer drives, has to depend on daughter to run to pharmacy and sometimes daughter is not able to get there in timely manner.  I suggested she contact PCP or pharmacy about setting herself up for home mail delivery.  No further needs identified.  TOC sign off.  Addendum:  Nebulizer ordered from [place] for delivery home, per RN request.    Final next level of care: Home w Home Health Services Barriers to Discharge: No Barriers Identified   Patient Goals and CMS Choice        Discharge Placement                       Discharge Plan and Services                                     Social Determinants of Health (SDOH) Interventions     Readmission Risk Interventions No flowsheet data found.

## 2021-10-26 LAB — CULTURE, BLOOD (ROUTINE X 2): Culture: NO GROWTH

## 2021-10-26 LAB — UREA NITROGEN, URINE: Urea Nitrogen, Ur: 399 mg/dL

## 2021-10-27 NOTE — Discharge Summary (Addendum)
Physician Discharge Summary   Patient name: Veronica Jimenez  Admit date:     10/20/2021  Discharge date: 10/25/2021   Discharge Physician: Berle Mull   PCP: Alma Friendly, MD   Recommendations at discharge: follow up with PCP in 1 week  Discharge Diagnoses Principal Problem:   Acute respiratory failure with hypoxia [facility]) Active Problems:   Influenza A   Humeral fracture   Essential hypertension   Pancytopenia ([place])   Acute renal failure superimposed on stage 4 chronic kidney disease ([place])   AF (paroxysmal atrial fibrillation) ([place])   History of ductal carcinoma in situ (DCIS) of breast   Hospital Course   11/30 presented with a fall most likely secondary to hypoxia from influenza. 12/4 started on prednisone.   * Acute respiratory failure with hypoxia ([place]) Has been having cough and shortness of breath for almost 2 weeks.   influenza positive on admission. Needed oxygen now on room air. Chest x-ray shows worsening vascular congestion.  Influenza A Fever curve improving. Continues to have bilateral expiratory wheezing.  Continue Tamiflu. Repeat chest x-ray 12/3 shows vascular congestion. Continue cough suppressant, added nebulizer therapy, flutter valve as well. Was given prednisone.  Essential hypertension Blood pressure fluctuating significantly.  Early in the morning on 12/2 blood pressure was 130 systolic.  Later in the day with physical therapy blood pressure was in 86V systolic. Currently on Norvasc, hydralazine, metoprolol which are Her home medication.  Humeral fracture Primary reason for presentation to ER was fall. Found to have humeral fracture. Orthopedic was consulted by ER on the phone.   Recommend outpatient follow-up and pain control.  Outpatient follow-up recommended.  Pancytopenia (HCC) Baseline hemoglobin around 12.   On presentation hemoglobin 9.6.  Currently stable around 8. Platelet count also low.  WBC also low.  This is all  dilutional in nature.  Acute renal failure superimposed on stage 4 chronic kidney disease (HCC) Baseline serum creatinine around 2.2 8/22 in Care Everywhere.  On presentation serum creatinine around 3.2. Ultrasound renal negative for any obstruction. Making enough urine.  Ins and outs are not accurate. FENa 1.3 %.  Showing intrinsic etiology. Avoid nephrotoxic medication. Renal function is improving now close to normal.  AF (paroxysmal atrial fibrillation) (HCC) Not on anticoagulation. Followed by cardiology.  History of ductal carcinoma in situ (DCIS) of breast Followed by [facility] oncology.  Procedures performed: none   Condition at discharge: good  Exam General: Appear in mild distress, no Rash; Oral Mucosa Clear, moist. no Abnormal Neck Mass Or lumps, Conjunctiva normal  Cardiovascular: S1 and S2 Present, no Murmur, Respiratory: good respiratory effort, Bilateral Air entry present and CTA, no Crackles, bilateral wheezes Abdomen: Bowel Sound present, Soft and no tenderness Extremities: no Pedal edema Neurology: alert and oriented to time, place, and person affect appropriate. no new focal deficit  Disposition: Home  Discharge time: greater than 30 minutes.  Follow-up Information     Alma Friendly, MD. Schedule an appointment as soon as possible for a visit in 1 week(s).   Specialty: Internal Medicine Contact information: [address] [place] [address] [phone]         Nicholes Stairs, MD. Schedule an appointment as soon as possible for a visit in 2 week(s).   Specialty: Orthopedic Surgery Contact information: [address] [place] [address] [phone]         Candy Sledge, DO. Schedule an appointment as soon as possible for a visit in 2 week(s).   Specialty: Nephrology  Why: with CBC and BMP Contact information: [address] [place] [address] [place],  [facility] Follow up.   Specialty: Home Health Services Why: They will contact you within 48 hours of d/c to set up a time to come out. Contact information: [place] [address] [phone]                 Allergies as of 10/25/2021       Reactions   Hydrocodone Itching   Shellfish Allergy Shortness Of Breath        Medication List     STOP taking these medications    amLODipine-benazepril 10-40 MG capsule Commonly known as: LOTREL   azithromycin 250 MG tablet Commonly known as: ZITHROMAX       TAKE these medications    acetaminophen 500 MG tablet Commonly known as: TYLENOL Take 500 mg by mouth every 6 (six) hours as needed.   albuterol 108 (90 Base) MCG/ACT inhaler Commonly known as: VENTOLIN HFA Inhale 1-2 puffs into the lungs every 6 (six) hours as needed for wheezing or shortness of breath.   amLODipine 5 MG tablet Commonly known as: NORVASC Take 1 tablet (5 mg total) by mouth daily.   aspirin EC 81 MG tablet Take 81 mg by mouth daily.   benzonatate 100 MG capsule Commonly known as: TESSALON Take 1 capsule (100 mg total) by mouth 3 (three) times daily.   dextromethorphan-guaiFENesin 30-600 MG 12hr tablet Commonly known as: MUCINEX DM Take 1 tablet by mouth 2 (two) times daily for 14 days.   fexofenadine 60 MG tablet Commonly known as: ALLEGRA Take 60 mg by mouth 3 times/day as needed-between meals & bedtime for allergies or rhinitis.   furosemide 20 MG tablet Commonly known as: LASIX Take 20 mg by mouth daily as needed for fluid or edema.   guaiFENesin 600 MG 12 hr tablet Commonly known as: MUCINEX Take 600 mg by mouth 2 (two) times daily as needed for cough or to loosen phlegm.   hydrALAZINE 25 MG tablet Commonly known as: APRESOLINE Take one tablet by mouth 3 times a day.  IF systolic blood pressure greater than 180-take an additional tablet once by mouth. What changed:  how much to take how to  take this when to take this   ipratropium-albuterol 0.5-2.5 (3) MG/3ML Soln Commonly known as: DUONEB Take 3 mLs by nebulization 3 (three) times daily for 14 days.   linagliptin 5 MG Tabs tablet Commonly known as: TRADJENTA Take 5 mg by mouth daily.   LORazepam 0.5 MG tablet Commonly known as: ATIVAN Take 0.5 mg by mouth at bedtime as needed for sleep.   metoprolol tartrate 50 MG tablet Commonly known as: LOPRESSOR Take 1 tablet (50 mg total) by mouth 2 (two) times daily.   montelukast 10 MG tablet Commonly known as: SINGULAIR Take 10 mg by mouth at bedtime.   omeprazole 40 MG capsule Commonly known as: PRILOSEC Take 40 mg by mouth daily as needed (acid reflux).   pravastatin 80 MG tablet Commonly known as: PRAVACHOL Take 1 tablet (80 mg total) by mouth daily.   promethazine 12.5 MG tablet Commonly known as: PHENERGAN Take 12.5 mg by mouth 4 (four) times daily as needed for nausea/vomiting.   Spiriva Respimat 1.25 MCG/ACT Aers Generic drug: Tiotropium Bromide Monohydrate 1-2 puffs every morning   traMADol 50 MG tablet Commonly known as: ULTRAM Take  50 mg by mouth every 6 (six) hours as needed for moderate pain.   VITAMIN C PO Take 1 tablet by mouth daily.        Filed Weights   10/20/21 1145  Weight: 64.9 kg    DG Chest 2 View  Result Date: 10/23/2021 CLINICAL DATA:  A 75 year old female presents for evaluation of cough, shortness of breath and weakness. Recent diagnosis of influenza. EXAM: CHEST - 2 VIEW COMPARISON:  October 20, 2021. FINDINGS: Trachea midline. Cardiomediastinal contours with mild cardiac enlargement similar to prior imaging. Aortic atherosclerosis. Central pulmonary vascular congestion. Patchy opacities have developed in the LEFT lung base. No lobar level consolidative process. Mild interstitial prominence. Small basilar effusions. On limited assessment there is no acute skeletal process with signs of RIGHT humeral fracture better seen on  previous shoulder radiographs. IMPRESSION: Interstitial prominence and patchy LEFT basilar airspace disease could reflect findings of viral or atypical infection. Given development of small effusions and interstitial prominence in this patient with cardiac enlargement would also consider the possibility of concomitant heart failure or volume overload. Electronically Signed   By: Zetta Bills M.D.   On: 10/23/2021 13:51   DG Chest 2 View  Result Date: 10/20/2021 CLINICAL DATA:  Possible sepsis. EXAM: CHEST - 2 VIEW COMPARISON:  March 16, 2019. FINDINGS: The heart size and mediastinal contours are within normal limits. Both lungs are clear. The visualized skeletal structures are unremarkable. IMPRESSION: No active cardiopulmonary disease. Aortic Atherosclerosis (ICD10-I70.0). Electronically Signed   By: Marijo Conception M.D.   On: 10/20/2021 14:14   DG Shoulder Right  Result Date: 10/20/2021 CLINICAL DATA:  Right shoulder pain after fall last night. EXAM: RIGHT SHOULDER - 2+ VIEW COMPARISON:  None. FINDINGS: Moderately displaced fracture is seen involving the proximal right humeral head and neck. No definite dislocation is noted. IMPRESSION: Moderately displaced proximal right humeral head and neck fracture. Electronically Signed   By: Marijo Conception M.D.   On: 10/20/2021 14:17   CT Head Wo Contrast  Result Date: 10/20/2021 CLINICAL DATA:  Fall from bed, facial bruising EXAM: CT HEAD WITHOUT CONTRAST CT MAXILLOFACIAL WITHOUT CONTRAST CT CERVICAL SPINE WITHOUT CONTRAST TECHNIQUE: Multidetector CT imaging of the head, cervical spine, and maxillofacial structures were performed using the standard protocol without intravenous contrast. Multiplanar CT image reconstructions of the cervical spine and maxillofacial structures were also generated. COMPARISON:  None. FINDINGS: CT HEAD FINDINGS Brain: No evidence of acute infarction, hemorrhage, hydrocephalus, extra-axial collection or mass lesion/mass effect.  Periventricular and deep white matter hypodensity. Vascular: No hyperdense vessel or unexpected calcification. CT FACIAL BONES FINDINGS Skull: Normal. Negative for fracture or focal lesion. Facial bones: No displaced fractures or dislocations. Sinuses/Orbits: No acute finding. Other: None. CT CERVICAL SPINE FINDINGS Alignment: Normal. Skull base and vertebrae: No acute fracture. No primary bone lesion or focal pathologic process. Soft tissues and spinal canal: No prevertebral fluid or swelling. No visible canal hematoma. Disc levels: Moderate multilevel disc space height loss and osteophytosis. Upper chest: Negative. Other: None. IMPRESSION: 1. No acute intracranial pathology. Small-vessel white matter disease. 2. No displaced fracture or dislocation of the facial bones. 3. No fracture or static subluxation of the cervical spine. 4. Moderate multilevel cervical disc degenerative disease. Electronically Signed   By: Delanna Ahmadi M.D.   On: 10/20/2021 14:25   CT Cervical Spine Wo Contrast  Result Date: 10/20/2021 CLINICAL DATA:  Fall from bed, facial bruising EXAM: CT HEAD WITHOUT CONTRAST CT MAXILLOFACIAL WITHOUT CONTRAST CT CERVICAL SPINE  WITHOUT CONTRAST TECHNIQUE: Multidetector CT imaging of the head, cervical spine, and maxillofacial structures were performed using the standard protocol without intravenous contrast. Multiplanar CT image reconstructions of the cervical spine and maxillofacial structures were also generated. COMPARISON:  None. FINDINGS: CT HEAD FINDINGS Brain: No evidence of acute infarction, hemorrhage, hydrocephalus, extra-axial collection or mass lesion/mass effect. Periventricular and deep white matter hypodensity. Vascular: No hyperdense vessel or unexpected calcification. CT FACIAL BONES FINDINGS Skull: Normal. Negative for fracture or focal lesion. Facial bones: No displaced fractures or dislocations. Sinuses/Orbits: No acute finding. Other: None. CT CERVICAL SPINE FINDINGS Alignment:  Normal. Skull base and vertebrae: No acute fracture. No primary bone lesion or focal pathologic process. Soft tissues and spinal canal: No prevertebral fluid or swelling. No visible canal hematoma. Disc levels: Moderate multilevel disc space height loss and osteophytosis. Upper chest: Negative. Other: None. IMPRESSION: 1. No acute intracranial pathology. Small-vessel white matter disease. 2. No displaced fracture or dislocation of the facial bones. 3. No fracture or static subluxation of the cervical spine. 4. Moderate multilevel cervical disc degenerative disease. Electronically Signed   By: Delanna Ahmadi M.D.   On: 10/20/2021 14:25   US RENAL  Result Date: 10/22/2021 CLINICAL DATA:  AKI EXAM: RENAL / URINARY TRACT ULTRASOUND COMPLETE COMPARISON:  CT May 06, 2017 FINDINGS: Right Kidney: Renal measurements: 11.1 x 5.5 x 4.5 cm = volume: 196 mL. Echogenicity within normal limits. No hydronephrosis. Multiple renal cysts largest of which measures 3.7 cm. Left Kidney: Renal measurements: 11.5 x 6.3 x 6.0 cm = volume: 229 mL. Echogenicity within normal limits. No hydronephrosis. Multiple renal cysts largest of which measures 3 cm. Bladder: Appears normal for degree of bladder distention. Other: None. IMPRESSION: No hydronephrosis. Electronically Signed   By: Dahlia Bailiff M.D.   On: 10/22/2021 19:54   DG Shoulder Left  Result Date: 10/20/2021 CLINICAL DATA:  Left shoulder injury after fall last night. EXAM: LEFT SHOULDER - 2+ VIEW COMPARISON:  None. FINDINGS: There is no evidence of fracture or dislocation. There is no evidence of arthropathy or other focal bone abnormality. Soft tissues are unremarkable. IMPRESSION: Negative. Electronically Signed   By: Marijo Conception M.D.   On: 10/20/2021 14:16   VAS [place] LOWER EXTREMITY VENOUS (DVT)  Result Date: 10/21/2021  Lower Venous DVT Study Patient Name:  BREIANNA DELFINO  Date of Exam:   10/21/2021 Medical Rec #: [id number]           Accession #:    [id number]  Date of Birth: December 13, 1945           Patient Gender: F Patient Age:   75 years Exam Location:  [facility] Procedure:      VAS [place] LOWER EXTREMITY VENOUS (DVT) Referring Phys: CHING TU --------------------------------------------------------------------------------  Other Indications: Elevated d-dimer= 16.41. Comparison Study: No prior study Performing Technologist: Maudry Mayhew MHA, RDMS, RVT, RDCS  Examination Guidelines: A complete evaluation includes B-mode imaging, spectral Doppler, color Doppler, and power Doppler as needed of all accessible portions of each vessel. Bilateral testing is considered an integral part of a complete examination. Limited examinations for reoccurring indications may be performed as noted. The reflux portion of the exam is performed with the patient in reverse Trendelenburg.  +---------+---------------+---------+-----------+----------+--------------+ RIGHT    CompressibilityPhasicitySpontaneityPropertiesThrombus Aging +---------+---------------+---------+-----------+----------+--------------+ CFV      Full           Yes      Yes                                 +---------+---------------+---------+-----------+----------+--------------+  SFJ      Full                                                        +---------+---------------+---------+-----------+----------+--------------+ FV Prox  Full                                                        +---------+---------------+---------+-----------+----------+--------------+ FV Mid   Full                                                        +---------+---------------+---------+-----------+----------+--------------+ FV DistalFull                                                        +---------+---------------+---------+-----------+----------+--------------+ PFV      Full                                                         +---------+---------------+---------+-----------+----------+--------------+ POP      Full           Yes      Yes                                 +---------+---------------+---------+-----------+----------+--------------+ PTV      Full                                                        +---------+---------------+---------+-----------+----------+--------------+ PERO     Full                                                        +---------+---------------+---------+-----------+----------+--------------+   +---------+---------------+---------+-----------+----------+--------------+ LEFT     CompressibilityPhasicitySpontaneityPropertiesThrombus Aging +---------+---------------+---------+-----------+----------+--------------+ CFV      Full           Yes      Yes                                 +---------+---------------+---------+-----------+----------+--------------+ SFJ      Full                                                        +---------+---------------+---------+-----------+----------+--------------+  FV Prox  Full                                                        +---------+---------------+---------+-----------+----------+--------------+ FV Mid   Full                                                        +---------+---------------+---------+-----------+----------+--------------+ FV DistalFull                                                        +---------+---------------+---------+-----------+----------+--------------+ PFV      Full                                                        +---------+---------------+---------+-----------+----------+--------------+ POP      Full           Yes      Yes                                 +---------+---------------+---------+-----------+----------+--------------+ PTV      Full                                                         +---------+---------------+---------+-----------+----------+--------------+ PERO     Full                                                        +---------+---------------+---------+-----------+----------+--------------+     Summary: RIGHT: - There is no evidence of deep vein thrombosis in the lower extremity.  - No cystic structure found in the popliteal fossa.  LEFT: - There is no evidence of deep vein thrombosis in the lower extremity.  - No cystic structure found in the popliteal fossa.  *See table(s) above for measurements and observations. Electronically signed by Monica Coaxum MD on 10/21/2021 at 5:24:45 PM.    Final    CT Maxillofacial Wo Contrast  Result Date: 10/20/2021 CLINICAL DATA:  Fall from bed, facial bruising EXAM: CT HEAD WITHOUT CONTRAST CT MAXILLOFACIAL WITHOUT CONTRAST CT CERVICAL SPINE WITHOUT CONTRAST TECHNIQUE: Multidetector CT imaging of the head, cervical spine, and maxillofacial structures were performed using the standard protocol without intravenous contrast. Multiplanar CT image reconstructions of the cervical spine and maxillofacial structures were also generated. COMPARISON:  None. FINDINGS: CT HEAD FINDINGS Brain: No evidence of acute infarction, hemorrhage, hydrocephalus, extra-axial collection or mass lesion/mass effect. Periventricular and deep white matter hypodensity. Vascular: No hyperdense  vessel or unexpected calcification. CT FACIAL BONES FINDINGS Skull: Normal. Negative for fracture or focal lesion. Facial bones: No displaced fractures or dislocations. Sinuses/Orbits: No acute finding. Other: None. CT CERVICAL SPINE FINDINGS Alignment: Normal. Skull base and vertebrae: No acute fracture. No primary bone lesion or focal pathologic process. Soft tissues and spinal canal: No prevertebral fluid or swelling. No visible canal hematoma. Disc levels: Moderate multilevel disc space height loss and osteophytosis. Upper chest: Negative. Other: None. IMPRESSION: 1. No  acute intracranial pathology. Small-vessel white matter disease. 2. No displaced fracture or dislocation of the facial bones. 3. No fracture or static subluxation of the cervical spine. 4. Moderate multilevel cervical disc degenerative disease. Electronically Signed   By: Delanna Ahmadi M.D.   On: 10/20/2021 14:25   Results for orders placed or performed during the hospital encounter of 10/20/21  Resp Panel by RT-PCR (Flu A&B, Covid) Nasopharyngeal Swab     Status: Abnormal   Collection Time: 10/20/21 12:32 PM   Specimen: Nasopharyngeal Swab; Nasopharyngeal(NP) swabs in vial transport medium  Result Value Ref Range Status   SARS Coronavirus 2 by RT PCR NEGATIVE NEGATIVE Final    Comment: (NOTE) SARS-CoV-2 target nucleic acids are NOT DETECTED.  The SARS-CoV-2 RNA is generally detectable in upper respiratory specimens during the acute phase of infection. The lowest concentration of SARS-CoV-2 viral copies this assay can detect is 138 copies/mL. A negative result does not preclude SARS-Cov-2 infection and should not be used as the sole basis for treatment or other patient management decisions. A negative result may occur with  improper specimen collection/handling, submission of specimen other than nasopharyngeal swab, presence of viral mutation(s) within the areas targeted by this assay, and inadequate number of viral copies(<138 copies/mL). A negative result must be combined with clinical observations, patient history, and epidemiological information. The expected result is Negative.  Fact Sheet for Patients:  EntrepreneurPulse.com.au  Fact Sheet for Healthcare Providers:  IncredibleEmployment.be  This test is no t yet approved or cleared by the [place] FDA and  has been authorized for detection and/or diagnosis of SARS-CoV-2 by FDA under an Emergency Use Authorization (EUA). This EUA will remain  in effect (meaning this test can be used) for  the duration of the COVID-19 declaration under Section 564(b)(1) of the Act, 21 U.S.C.section 360bbb-3(b)(1), unless the authorization is terminated  or revoked sooner.       Influenza A by PCR POSITIVE (A) NEGATIVE Final   Influenza B by PCR NEGATIVE NEGATIVE Final    Comment: (NOTE) The Xpert Xpress SARS-CoV-2/FLU/RSV plus assay is intended as an aid in the diagnosis of influenza from Nasopharyngeal swab specimens and should not be used as a sole basis for treatment. Nasal washings and aspirates are unacceptable for Xpert Xpress SARS-CoV-2/FLU/RSV testing.  Fact Sheet for Patients: EntrepreneurPulse.com.au  Fact Sheet for Healthcare Providers: IncredibleEmployment.be  This test is not yet approved or cleared by the [place] FDA and has been authorized for detection and/or diagnosis of SARS-CoV-2 by FDA under an Emergency Use Authorization (EUA). This EUA will remain in effect (meaning this test can be used) for the duration of the COVID-19 declaration under Section 564(b)(1) of the Act, 21 U.S.C. section 360bbb-3(b)(1), unless the authorization is terminated or revoked.  Performed at [facility], [address] [place], [place] [address]   Culture, blood (Routine x 2)     Status: None   Collection Time: 10/20/21  1:05 PM   Specimen: BLOOD  Result Value Ref  Range Status   Specimen Description   Final    BLOOD BLOOD RIGHT FOREARM Performed at [facility], [address] [place], [place] [address]    Special Requests   Final    Blood Culture adequate volume BOTTLES DRAWN AEROBIC AND ANAEROBIC Performed at [facility], [address] [place], [place] [address]    Culture   Final    NO GROWTH 5 DAYS Performed at [facility], [place] [address], [place], [place] [address]    Report Status 10/25/2021 FINAL  Final  Culture, blood (Routine x 2)     Status: None   Collection Time: 10/20/21   7:07 PM   Specimen: BLOOD  Result Value Ref Range Status   Specimen Description   Final    BLOOD LEFT ANTECUBITAL Performed at [facility] [address] [place], [place] [address]    Special Requests   Final    BOTTLES DRAWN AEROBIC ONLY Blood Culture results may not be optimal due to an inadequate volume of blood received in culture bottles Performed at [facility] [address] [place], [place] [address]    Culture   Final    NO GROWTH 5 DAYS Performed at [facility], [place] [address], [place],  [address]    Report Status 10/26/2021 FINAL  Final   Recent Labs  Lab 10/20/21 1232 10/21/21 0656 10/22/21 0545 10/23/21 0630 10/24/21 0546  WBC 10.4 7.9 9.0 7.3 9.8  NEUTROABS 7.6  --  6.6 4.9 8.1*  HGB 9.6* 8.3* 8.6* 8.1* 8.6*  HCT 28.2* 25.1* 27.1* 26.1* 26.1*  MCV 93.1 95.8 96.8 99.2 94.2  PLT 178 131* 129* 120* 137*   Recent Labs  Lab 10/21/21 0451 10/22/21 0545 10/23/21 0630 10/24/21 0546 10/25/21 0501  NA 133* 132* 136 135 133*  K 4.0 4.0 4.0 3.5 3.5  CL 109 106 112* 115* 111  CO2 14* 16* 16* 15* 16*  GLUCOSE 87 126* 108* 135* 187*  BUN 39* 41* 35* 30* 28*  CREATININE 3.21* 3.42* 3.35* 2.75* 2.48*  CALCIUM 8.9 8.6* 8.7* 9.4 10.3  PHOS  --   --  3.5 2.7 2.8   Recent Labs  Lab 10/20/21 1232 10/22/21 0545 10/23/21 0630 10/24/21 0546 10/25/21 0501  AST 20 22  --   --   --   ALT 13 13  --   --   --   ALKPHOS 51 44  --   --   --   BILITOT 0.3 0.4  --   --   --   PROT 6.5 6.3*  --   --   --   ALBUMIN 3.3* 3.0* 2.9* 3.0* 3.2*   No results for input(s): GLUCAP in the last 168 hours.  Author: Berle Mull [facility]

## 2021-11-10 ENCOUNTER — Telehealth: Payer: Self-pay | Admitting: Cardiology

## 2021-11-10 DIAGNOSIS — I10 Essential (primary) hypertension: Secondary | ICD-10-CM

## 2021-11-10 NOTE — Telephone Encounter (Signed)
Per DOD:  Left detailed message for Pt.  Advised to restart amlodipine 5 mg daily.  Advised to call office Friday if her HR's will still high.  Otherwise-keep log and this nurse will call next week to review.

## 2021-11-10 NOTE — Telephone Encounter (Signed)
Spoke with the patient who states that her blood pressure has been increased for the past several days. She states that she was hospitalized and they stopped her amlodipine-benazapril tablet. They put her on just amlodipine 5 mg daily. She states that she went to her nephrologist on Monday and her BP was 118/56. They stopped her amlodipine. She reports that blood pressure has been high since then. On Monday night BP was 209/123. She reports last night it was 187/95. Her physical therapist came by this afternoon and it was 185/90 and she was told to contact her cardiologist. I have advised the patient to go ahead and take an additional hydralazine to see if pressure will come down. She is compliant with other medications. She does report getting a headache when her BP is high. Patient reports compliance with low sodium diet.

## 2021-11-10 NOTE — Telephone Encounter (Signed)
Pt states that she was informed to reach out whenever blood pressure is outside of specific parameters.. please further advise

## 2021-11-11 NOTE — Telephone Encounter (Signed)
Patient returning call - left message with after hours service

## 2021-11-12 NOTE — Telephone Encounter (Signed)
Spoke with Pt.  Advised to restart amlodipine 5 mg daily.  Pt states that's what her kidney doctor advised also.  She will call with further issues.

## 2021-11-17 NOTE — Telephone Encounter (Signed)
Pt c/o BP issue: STAT if pt c/o blurred vision, one-sided weakness or slurred speech  1. What are your last 5 BP readings?  179/101  189/95  172/83  154/81  90 154/88  77  2. Are you having any other symptoms (ex. Dizziness, headache, blurred vision, passed out)?  Occasional dizziness  3. What is your BP issue?   Patient is following up. She states she has been back on the Amlodipine 5 MG as advised, but her BP is still elevated.

## 2021-11-18 NOTE — Telephone Encounter (Signed)
Pt called into office with concerns that recent medication change of beginning Amlodipine 5mg  QD (starting 12/23) is not working to lower her BP. Pt supplied following readings: 12/26- 179/101 (evening reading, states she was laying in bed) 12/27- 172/83 (evening reading approx 6pm) 12/28- 154/81 (AM reading with time unknown) 12/29- 158/77 (4 AM reading while lying in bed)  Pt states that she has been using her prescribed Hydralazine four times daily and that upon starting the Amlodipine, her pressures did drop, but only slightly. Pt denies experiencing any symptoms associated with her blood pressure, such as dizziness/ blurred vision/ headaches. Pt would like MD to adjust or change medication because she feels that most of her readings are still too high and condones that the evening readings are much higher than usual.   Pt was advised to check her BP at approximately the same time each day and in the same position in order to get a more clear idea of how her pressure is trending. Will send to MD for advisement on plan of action.

## 2021-11-18 NOTE — Telephone Encounter (Signed)
Pt is f/u on previous notes regarding her BP. Please advise pt further

## 2021-11-19 NOTE — Addendum Note (Signed)
Addended by: Willeen Cass A on: 11/19/2021 02:32 PM   Modules accepted: Orders

## 2021-11-19 NOTE — Telephone Encounter (Signed)
Outreach made to Pt.  Advised per Dr. Linna Hoff would like her to see the HTN clinic at [facility] for blood pressure management.  Pt scheduled for January 17 at 3:00 pm.  Advised Pt to continue to monitor her BP and keep log to bring with her to upcoming appt.   Also advised to bring ALL of her medications with her to this appt.  Pt indicates understanding.

## 2021-12-06 NOTE — Progress Notes (Signed)
Patient ID: Veronica Jimenez                 DOB: 1946/08/04                      MRN: [id number]    HPI: Veronica Jimenez is a 76 y.o. female referred by Dr. Quentin Jimenez to HTN clinic. PMH is significant for HTN, Afib, HLD, T2DM, CKD stage 4, anemia. She called 11/10/21 stating her BP had been elevated. She was hospitalized with the flu at the end of November and she was switched from amlodipine-benazepril to just amlodipine due to AKI. Then when she saw her nephrologist they stopped her amlodipine for BP 118/56. Since then, BP was elevated to 180s-200s/90s-120s, and amlodipine was resumed. She reported BP remained high with readings 150s/80s-180s/90s and she was referred to HTN clinic.   Today, patient arrives in good spirits. Brings log of BP readings. She confirms that she used to take 10 mg of amlodipine when it was in combination with benazepril but is only on 5 mg currently. She cannot remember having any adverse effects with any BP medication. She takes an extra hydralazine at night if her BP is >180. She notes that her BP in the mornings is close to goal but rises at night. She is woken up 2-3 times per week with a headache and restlessness and her BP is elevated. She has not needed an extra dose of hydralazine lately though. BP has improved since starting amlodipine but most home readings are not at goal. Denies dizziness, swelling, blurred vision.   Current HTN meds: amlodipine 5 mg daily (PM), metoprolol tartrate 50 mg BID, hydralazine 25 mg TID (if SBP>180 take an additional tablet), furosemide 20 mg prn  Previously tried: amlodipine-benazepril 10-40 mg (switched to amlodipine at 09/2021 hospitalization due to AKI) BP goal: <130/80 mmHg  Family History: The patient's family history includes Congestive Heart Failure in her sister; Hypertension in her brother, brother, father, mother, sister, sister, and sister; Kidney disease in her brother, father, sister, and sister; Stroke in her  mother.  Social History: Never smoker  Diet: 1 cup coffee in the morning, endorses low salt diet. Has lower appetite since hospitalization, reporting losing 15 lbs since then. Appetite is slowly improving.   Exercise: Currently doing physical therapy for her arm (recent fracture from a fall when she had the flu) and to improve her balance. She used to walk more when it is warm out but hasn't been doing that lately.   Home BP readings: Uses bicep cuff. AM readings generally 130s/60s except this morning it was 169/82. At night it rises and ranges 140-170/70-90s lately. Denies any systolic readings <751.   Wt Readings from Last 3 Encounters:  10/20/21 143 lb (64.9 kg)  04/13/21 142 lb (64.4 kg)  01/05/21 144 lb (65.3 kg)   BP Readings from Last 3 Encounters:  10/25/21 (!) 149/73  04/13/21 (!) 152/70  01/05/21 (!) 168/80   Pulse Readings from Last 3 Encounters:  10/25/21 98  04/13/21 74  01/05/21 (!) 115    Renal function: CrCl cannot be calculated (Patient's most recent lab result is older than the maximum 21 days allowed.).  Past Medical History:  Diagnosis Date   Cancer ([place])    Diabetes mellitus without complication ([place])    Hyperlipemia    Hypertension     Current Outpatient Medications on File Prior to Visit  Medication Sig Dispense Refill   acetaminophen (TYLENOL) 500 MG tablet Take  500 mg by mouth every 6 (six) hours as needed.     albuterol (VENTOLIN HFA) 108 (90 Base) MCG/ACT inhaler Inhale 1-2 puffs into the lungs every 6 (six) hours as needed for wheezing or shortness of breath.     amLODipine (NORVASC) 5 MG tablet Take 1 tablet (5 mg total) by mouth daily. 30 tablet 0   Ascorbic Acid (VITAMIN C PO) Take 1 tablet by mouth daily.     aspirin EC 81 MG tablet Take 81 mg by mouth daily.     benzonatate (TESSALON) 100 MG capsule Take 1 capsule (100 mg total) by mouth 3 (three) times daily. 20 capsule 0   fexofenadine (ALLEGRA) 60 MG tablet Take 60 mg by mouth 3  times/day as needed-between meals & bedtime for allergies or rhinitis.     furosemide (LASIX) 20 MG tablet Take 20 mg by mouth daily as needed for fluid or edema.     guaiFENesin (MUCINEX) 600 MG 12 hr tablet Take 600 mg by mouth 2 (two) times daily as needed for cough or to loosen phlegm.     hydrALAZINE (APRESOLINE) 25 MG tablet Take one tablet by mouth 3 times a day.  IF systolic blood pressure greater than 180-take an additional tablet once by mouth. (Patient taking differently: Take 25 mg by mouth 3 (three) times daily. Take one tablet by mouth 3 times a day.  IF systolic blood pressure greater than 180-take an additional tablet once by mouth.) 270 tablet 3   ipratropium-albuterol (DUONEB) 0.5-2.5 (3) MG/3ML SOLN Take 3 mLs by nebulization 3 (three) times daily for 14 days. 126 mL 0   linagliptin (TRADJENTA) 5 MG TABS tablet Take 5 mg by mouth daily.     LORazepam (ATIVAN) 0.5 MG tablet Take 0.5 mg by mouth at bedtime as needed for sleep.     metoprolol tartrate (LOPRESSOR) 50 MG tablet Take 1 tablet (50 mg total) by mouth 2 (two) times daily. 180 tablet 3   montelukast (SINGULAIR) 10 MG tablet Take 10 mg by mouth at bedtime.     omeprazole (PRILOSEC) 40 MG capsule Take 40 mg by mouth daily as needed (acid reflux).     pravastatin (PRAVACHOL) 80 MG tablet Take 1 tablet (80 mg total) by mouth daily. 90 tablet 3   promethazine (PHENERGAN) 12.5 MG tablet Take 12.5 mg by mouth 4 (four) times daily as needed for nausea/vomiting.     Tiotropium Bromide Monohydrate (SPIRIVA RESPIMAT) 1.25 MCG/ACT AERS 1-2 puffs every morning     traMADol (ULTRAM) 50 MG tablet Take 50 mg by mouth every 6 (six) hours as needed for moderate pain.     No current facility-administered medications on file prior to visit.    Allergies  Allergen Reactions   Hydrocodone Itching   Shellfish Allergy Shortness Of Breath    Assessment/Plan:  1. Hypertension - BP in clinic and at home above goal <130/80 mmHg. Will increase  amlodipine back to 10 mg daily and continue metoprolol tartrate 50 mg BID and hydralazine 25 mg TID with extra dose prn SBP >180. If additional BP control needed in the future can consider increasing hydralazine. Follow up visit in 1 month. Patient instructed to bring her home cuff so we can check it for accuracy. Also instructed patient to let us know if she starts having dizziness/low BP or if BP remains elevated and she is frequently waking up at night with symptoms of high BP.   Rebbeca Paul, PharmD PGY2 Ambulatory Care Pharmacy Resident  12/07/2021 4:35 PM

## 2021-12-07 ENCOUNTER — Other Ambulatory Visit: Payer: Self-pay

## 2021-12-07 ENCOUNTER — Ambulatory Visit: Payer: Medicare HMO | Admitting: Student-PharmD

## 2021-12-07 VITALS — BP 148/76 | HR 78

## 2021-12-07 DIAGNOSIS — I10 Essential (primary) hypertension: Secondary | ICD-10-CM

## 2021-12-07 MED ORDER — AMLODIPINE BESYLATE 10 MG PO TABS: 10.0000 mg | ORAL_TABLET | Freq: Every day | ORAL | 3 refills | Status: DC

## 2021-12-07 NOTE — Patient Instructions (Signed)
°  It was nice to see you today!  Your goal blood pressure is less than 130/80 mmHg. In clinic, your blood pressure was 148/76 mmHg.  Medication Changes: Increase amlodipine to 10 mg daily.   Continue metoprolol tartrate 50 mg twice daily, hydralazine 25 mg three times daily (extra 1 tablet if BP >180)  Monitor blood pressure at home daily and keep a log (on your phone or piece of paper) to bring with you to your next visit. Write down date, time, blood pressure and pulse. Please bring your blood pressure cuff to your next visit so we can check it for accuracy.   Keep up the good work with diet and exercise. Aim for a diet full of vegetables, fruit and lean meats (chicken, [place], fish). Try to limit salt intake by eating fresh or frozen vegetables (instead of canned), rinse canned vegetables prior to cooking and do not add any additional salt to meals.

## 2022-01-10 NOTE — Progress Notes (Signed)
Patient ID: Veronica Jimenez                 DOB: 1946-01-20                      MRN: [id number]     HPI: Veronica Jimenez is a 76 y.o. female referred by Dr. Quentin Ore to HTN clinic. PMH is significant for HTN, Afib, HLD, T2DM, CKD stage 4, anemia. She called 11/10/21 stating her BP had been elevated. She was hospitalized with the flu at the end of November and she was switched from amlodipine-benazepril to just amlodipine due to AKI. Then when she saw her nephrologist they stopped her amlodipine for BP 118/56. Since then, BP was elevated to 180s-200s/90s-120s, and amlodipine was resumed. She reported BP remained high with readings 150s/80s-180s/90s and she was referred to HTN clinic. At last HTN clinic visit on 12/07/21, BP was 148/76 and amlodipine was increased to 10 mg.   Today, patient arrives in good spirits and brings her home BP machine to validate with clinic cuff (see below). Denies headaches, blurred vision, swelling. Occasional lightheadedness when she stands up too quickly. She is no longer waking up at night with a headache and high BP. She has not needed to use the 4th dose of hydralazine (to be used PRN if SBP >180 at night). Confirmed she is taking all medications as prescribed and she took her morning medications today. She reports she just drank a cup of coffee at her daughter's house before the visit and she is due for her 4pm hydralazine dose. She requests a refill of her furosemide which she uses PRN swelling. She has not needed to use this lately but notes that she has more LEE in the summer and wants to have some on hand. Her PCP used to prescribe this but has deferred refills to cardiology.   Home BP cuff: 149/80, HR 77 Clinic BP cuff: 144/68, HR 78  Current HTN meds: amlodipine 10 mg daily (PM), metoprolol tartrate 50 mg BID, hydralazine 25 mg TID (if SBP>180 take an additional tablet), furosemide 20 mg prn  Previously tried: amlodipine-benazepril 10-40 mg (switched to  amlodipine at 09/2021 hospitalization due to AKI) BP goal: <130/80 mmHg  Family History: The patient's family history includes Congestive Heart Failure in her sister; Hypertension in her brother, brother, father, mother, sister, sister, and sister; Kidney disease in her brother, father, sister, and sister; Stroke in her mother.  Social History: Never smoker  Diet: 1 cup coffee/day, endorses low salt diet. Has lower appetite since 09/2021 hospitalization. Appetite is slowly improving.   Exercise: Currently doing physical therapy for her arm (fracture from a fall when she had the flu 09/2021) and to improve her balance. She used to walk more when it is warm out but hasn't been doing that lately.   Home BP readings: Uses bicep cuff - validated today to read 5 points higher systolic and 12 points higher diastolic than clinic cuff. AM: 117-120/60-70. PM: 120-130s/70s.   Wt Readings from Last 3 Encounters:  10/20/21 143 lb (64.9 kg)  04/13/21 142 lb (64.4 kg)  01/05/21 144 lb (65.3 kg)   BP Readings from Last 3 Encounters:  01/11/22 (!) 144/68  12/07/21 (!) 148/76  10/25/21 (!) 149/73   Pulse Readings from Last 3 Encounters:  01/11/22 78  12/07/21 78  10/25/21 98    Renal function: CrCl cannot be calculated (Patient's most recent lab result is older than the maximum 21 days allowed.).  Past Medical History:  Diagnosis Date   Cancer ([place])    Diabetes mellitus without complication ([place])    Hyperlipemia    Hypertension     Current Outpatient Medications on File Prior to Visit  Medication Sig Dispense Refill   acetaminophen (TYLENOL) 500 MG tablet Take 500 mg by mouth every 6 (six) hours as needed.     albuterol (VENTOLIN HFA) 108 (90 Base) MCG/ACT inhaler Inhale 1-2 puffs into the lungs every 6 (six) hours as needed for wheezing or shortness of breath.     amLODipine (NORVASC) 10 MG tablet Take 1 tablet (10 mg total) by mouth daily. 30 tablet 3   Ascorbic Acid (VITAMIN C PO) Take  1 tablet by mouth daily.     aspirin EC 81 MG tablet Take 81 mg by mouth daily.     benzonatate (TESSALON) 100 MG capsule Take 1 capsule (100 mg total) by mouth 3 (three) times daily. 20 capsule 0   fexofenadine (ALLEGRA) 60 MG tablet Take 60 mg by mouth 3 times/day as needed-between meals & bedtime for allergies or rhinitis.     guaiFENesin (MUCINEX) 600 MG 12 hr tablet Take 600 mg by mouth 2 (two) times daily as needed for cough or to loosen phlegm.     hydrALAZINE (APRESOLINE) 25 MG tablet Take one tablet by mouth 3 times a day.  IF systolic blood pressure greater than 180-take an additional tablet once by mouth. (Patient taking differently: Take 25 mg by mouth 3 (three) times daily. Take one tablet by mouth 3 times a day.  IF systolic blood pressure greater than 180-take an additional tablet once by mouth.) 270 tablet 3   ipratropium-albuterol (DUONEB) 0.5-2.5 (3) MG/3ML SOLN Take 3 mLs by nebulization 3 (three) times daily for 14 days. 126 mL 0   linagliptin (TRADJENTA) 5 MG TABS tablet Take 5 mg by mouth daily.     LORazepam (ATIVAN) 0.5 MG tablet Take 0.5 mg by mouth at bedtime as needed for sleep.     metoprolol tartrate (LOPRESSOR) 50 MG tablet Take 1 tablet (50 mg total) by mouth 2 (two) times daily. 180 tablet 3   montelukast (SINGULAIR) 10 MG tablet Take 10 mg by mouth at bedtime.     omeprazole (PRILOSEC) 40 MG capsule Take 40 mg by mouth daily as needed (acid reflux).     pravastatin (PRAVACHOL) 80 MG tablet Take 1 tablet (80 mg total) by mouth daily. 90 tablet 3   promethazine (PHENERGAN) 12.5 MG tablet Take 12.5 mg by mouth 4 (four) times daily as needed for nausea/vomiting.     Tiotropium Bromide Monohydrate (SPIRIVA RESPIMAT) 1.25 MCG/ACT AERS 1-2 puffs every morning     traMADol (ULTRAM) 50 MG tablet Take 50 mg by mouth every 6 (six) hours as needed for moderate pain.     No current facility-administered medications on file prior to visit.    Allergies  Allergen Reactions    Hydrocodone Itching   Shellfish Allergy Shortness Of Breath    Assessment/Plan:  1. Hypertension - BP above goal <130/80 mmHg in clinic today but home BP readings are at goal. Given home readings are at goal in spite of home cuff reading slightly higher than clinic cuff, will continue current medications: amlodipine 10 mg daily, metoprolol tartrate 50 mg BID, hydralazine 25 mg TID (if SBP>180 take an additional tablet), and furosemide 20 mg prn. She is due for her midday hydralazine dose so the morning dose is likely wearing off and she had  a cup of coffee prior to the visit, both of which could be increasing her BP today. Provided refills for furosemide per patient request. Will follow up with her in 1 month for a mid-morning appointment so we can confirm BP is at goal. Instructed her to continue checking BP daily and bring log of readings to next visit.    Rebbeca Paul, PharmD PGY2 Ambulatory Care Pharmacy Resident 01/11/2022 3:41 PM

## 2022-01-11 ENCOUNTER — Other Ambulatory Visit: Payer: Self-pay

## 2022-01-11 ENCOUNTER — Ambulatory Visit: Payer: Medicare HMO | Admitting: Student-PharmD

## 2022-01-11 VITALS — BP 144/68 | HR 78

## 2022-01-11 DIAGNOSIS — I10 Essential (primary) hypertension: Secondary | ICD-10-CM | POA: Diagnosis not present

## 2022-01-11 MED ORDER — FUROSEMIDE 20 MG PO TABS: 20.0000 mg | ORAL_TABLET | Freq: Every day | ORAL | 5 refills | Status: DC | PRN

## 2022-01-11 NOTE — Patient Instructions (Addendum)
It was nice to see you today!  Your goal blood pressure is less than 130/80 mmHg. In clinic, your blood pressure was 144/68 mmHg.  Medication Changes:  Continue your current medications  Monitor blood pressure at home daily and keep a log (on your phone or piece of paper) to bring with you to your next visit. Write down date, time, blood pressure and pulse.  Keep up the good work with diet and exercise. Aim for a diet full of vegetables, fruit and lean meats (chicken, [place], fish). Try to limit salt intake by eating fresh or frozen vegetables (instead of canned), rinse canned vegetables prior to cooking and do not add any additional salt to meals.   Follow up visit in 1 month.

## 2022-02-07 NOTE — Progress Notes (Signed)
Patient ID: Veronica Jimenez                 DOB: 07-20-1946                      MRN: [id number] ? ? ? ? ?HPI: ?Veronica Jimenez is a 76 y.o. female referred by Dr. Quentin Ore to HTN clinic. PMH is significant for HTN, Afib, HLD, T2DM, CKD stage 4, anemia. She called 11/10/21 stating her BP had been elevated. She was hospitalized with the flu at the end of November and she was switched from amlodipine-benazepril to just amlodipine due to AKI. Then when she saw her nephrologist they stopped her amlodipine for BP 118/56. Since then, BP was elevated to 180s-200s/90s-120s, and amlodipine was resumed. She reported BP remained high with readings 150s/80s-180s/90s and she was referred to HTN clinic. At initial HTN clinic visit on 12/07/21, BP was 148/76 and amlodipine was increased to 10 mg. At last visit, BP was 144/68 but home readings were at goal (clinic cuff validated with home cuff) and she was due for her midday hydralazine dose. Continued current medications with plan for BP recheck.  ? ?Today, patient arrives in good spirits. Reports adherence with her medications and has taken them this morning. Denies dizziness, headaches, blurred vision, swelling. Reports that two nights recently she had a high BP (SBP 170-180s) but it improved without taking an extra hydralazine. Her SBP at her nephrologist yesterday was 142. SBP at home has mostly been 130s-140s (her cuff reads about 5 points higher systolic).  ? ?Current HTN meds: amlodipine 10 mg daily (PM), metoprolol tartrate 50 mg BID, hydralazine 25 mg TID (if SBP>180 take an additional tablet), furosemide 20 mg prn  ?Previously tried: amlodipine-benazepril 10-40 mg (switched to amlodipine at 09/2021 hospitalization due to AKI) ?BP goal: <130/80 mmHg ? ?Family History: The patient's family history includes Congestive Heart Failure in her sister; Hypertension in her brother, brother, father, mother, sister, sister, and sister; Kidney disease in her brother, father,  sister, and sister; Stroke in her mother. ? ?Social History: Never smoker ? ?Diet: 1 cup coffee/day, endorses low salt diet. Has lower appetite since 09/2021 hospitalization. Appetite is slowly improving.  ? ?Exercise: She used to walk more when it is warm out but hasn't been doing that lately.  ? ?Home BP readings: Uses bicep cuff - validated to read 5 points higher systolic and 12 points higher diastolic than clinic cuff. Reading yesterday was 142/72.  ? ?Wt Readings from Last 3 Encounters:  ?10/20/21 143 lb (64.9 kg)  ?04/13/21 142 lb (64.4 kg)  ?01/05/21 144 lb (65.3 kg)  ? ?BP Readings from Last 3 Encounters:  ?02/08/22 (!) 130/58  ?01/11/22 (!) 144/68  ?12/07/21 (!) 148/76  ? ?Pulse Readings from Last 3 Encounters:  ?02/08/22 79  ?01/11/22 78  ?12/07/21 78  ? ? ?Renal function: ?CrCl cannot be calculated (Patient's most recent lab result is older than the maximum 21 days allowed.). ? ?Past Medical History:  ?Diagnosis Date  ? Cancer [facility])   ? Diabetes mellitus without complication ([place])   ? Hyperlipemia   ? Hypertension   ? ? ?Current Outpatient Medications on File Prior to Visit  ?Medication Sig Dispense Refill  ? acetaminophen (TYLENOL) 500 MG tablet Take 500 mg by mouth every 6 (six) hours as needed.    ? albuterol (VENTOLIN HFA) 108 (90 Base) MCG/ACT inhaler Inhale 1-2 puffs into the lungs every 6 (six) hours as needed for wheezing or shortness  of breath.    ? Ascorbic Acid (VITAMIN C PO) Take 1 tablet by mouth daily.    ? aspirin EC 81 MG tablet Take 81 mg by mouth daily.    ? benzonatate (TESSALON) 100 MG capsule Take 1 capsule (100 mg total) by mouth 3 (three) times daily. 20 capsule 0  ? fexofenadine (ALLEGRA) 60 MG tablet Take 60 mg by mouth 3 times/day as needed-between meals & bedtime for allergies or rhinitis.    ? furosemide (LASIX) 20 MG tablet Take 1 tablet (20 mg total) by mouth daily as needed for fluid or edema. 30 tablet 5  ? guaiFENesin (MUCINEX) 600 MG 12 hr tablet Take 600 mg by mouth 2  (two) times daily as needed for cough or to loosen phlegm.    ? ipratropium-albuterol (DUONEB) 0.5-2.5 (3) MG/3ML SOLN Take 3 mLs by nebulization 3 (three) times daily for 14 days. 126 mL 0  ? LORazepam (ATIVAN) 0.5 MG tablet Take 0.5 mg by mouth at bedtime as needed for sleep.    ? montelukast (SINGULAIR) 10 MG tablet Take 10 mg by mouth at bedtime.    ? omeprazole (PRILOSEC) 40 MG capsule Take 40 mg by mouth daily as needed (acid reflux).    ? pravastatin (PRAVACHOL) 80 MG tablet Take 1 tablet (80 mg total) by mouth daily. 90 tablet 3  ? promethazine (PHENERGAN) 12.5 MG tablet Take 12.5 mg by mouth 4 (four) times daily as needed for nausea/vomiting.    ? Tiotropium Bromide Monohydrate (SPIRIVA RESPIMAT) 1.25 MCG/ACT AERS 1-2 puffs every morning    ? traMADol (ULTRAM) 50 MG tablet Take 50 mg by mouth every 6 (six) hours as needed for moderate pain.    ? ?No current facility-administered medications on file prior to visit.  ? ? ?Allergies  ?Allergen Reactions  ? Hydrocodone Itching  ? Shellfish Allergy Shortness Of Breath  ? ? ?Assessment/Plan: ? ?1. Hypertension - BP was initially above goal <130/80 mmHg in clinic today but then improved to 130/58 after resting in the room alone. Home BP readings are close to goal given her cuff reads slightly higher. Will continue current medications: amlodipine 10 mg daily, metoprolol tartrate 50 mg BID, hydralazine 25 mg TID (if SBP>180 take an additional tablet), and furosemide 20 mg prn. Provided refills for all HTN medications per patient request. Instructed her to continue checking BP daily and to reach out if BP starts to rise or she has more frequent evening HTN episodes. Since BP is at goal and stable on current medications, will follow up as needed with HTN clinic.  ? ?Rebbeca Paul, PharmD ?PGY2 Ambulatory Care Pharmacy Resident ?02/08/2022 11:54 AM ? ?

## 2022-02-08 ENCOUNTER — Other Ambulatory Visit: Payer: Self-pay

## 2022-02-08 ENCOUNTER — Ambulatory Visit: Payer: Medicare HMO | Admitting: Student-PharmD

## 2022-02-08 DIAGNOSIS — I10 Essential (primary) hypertension: Secondary | ICD-10-CM | POA: Diagnosis not present

## 2022-02-08 MED ORDER — AMLODIPINE BESYLATE 10 MG PO TABS: 10.0000 mg | ORAL_TABLET | Freq: Every day | ORAL | 3 refills | Status: DC

## 2022-02-08 MED ORDER — HYDRALAZINE HCL 25 MG PO TABS: ORAL_TABLET | ORAL | 3 refills | Status: DC

## 2022-02-08 MED ORDER — METOPROLOL TARTRATE 50 MG PO TABS: 50.0000 mg | ORAL_TABLET | Freq: Two times a day (BID) | ORAL | 3 refills | Status: AC

## 2022-02-08 NOTE — Patient Instructions (Addendum)
It was nice to see you today! ? ?Your goal blood pressure is less than 130/80 mmHg. In clinic, your blood pressure was 130/58 mmHg. ? ?Medication Changes: ?Continue current medications.  ? ?Monitor blood pressure at home daily and keep a log (on your phone or piece of paper) to bring with you to your next visit. Write down date, time, blood pressure and pulse. ? ?Keep up the good work with diet and exercise. Aim for a diet full of vegetables, fruit and lean meats (chicken, [place], fish). Try to limit salt intake by eating fresh or frozen vegetables (instead of canned), rinse canned vegetables prior to cooking and do not add any additional salt to meals.  ?

## 2022-02-09 ENCOUNTER — Other Ambulatory Visit: Payer: Self-pay | Admitting: Cardiology

## 2022-03-15 ENCOUNTER — Telehealth: Payer: Self-pay | Admitting: Physician Assistant

## 2022-03-15 DIAGNOSIS — I10 Essential (primary) hypertension: Secondary | ICD-10-CM

## 2022-03-15 MED ORDER — HYDRALAZINE HCL 25 MG PO TABS: ORAL_TABLET | ORAL | 3 refills | Status: DC

## 2022-03-15 NOTE — Telephone Encounter (Signed)
Pt c/o BP issue: STAT if pt c/o blurred vision, one-sided weakness or slurred speech ? ?1. What are your last 5 BP readings? 201/95 ? ?2. Are you having any other symptoms (ex. Dizziness, headache, blurred vision, passed out)? Headache, blurry vision a lilttle) ? ?3. What is your BP issue? Is high ? ?

## 2022-03-15 NOTE — Telephone Encounter (Signed)
Returned patient's call. She reports that her BP has been high most nights this week. She used to experience this occasionally but it would happen in the middle of the night and wake her up and it is now happening around 11pm before she goes to bed. She denies any missed doses of her medications. Confirmed she is taking them correctly. No other triggers of elevated BP per patient report. Denies stroke like symptoms. When BP is elevated, experiences a headache and fatigue.  ? ?Last night BP was 201/95 around 11pm and she took an extra dose of 25 mg hydralazine per Dr. Mardene Speak previous instruction to use an additional tablet if SBP >180. She woke up around 3am and checked it again to make sure it was going down and it was 172/89. BP when she got out of bed at 11am was 108/69. She checked her BP again on the phone with me and it was 133/70. Of note, per BP cuff validation at previous HTN clinic visit, her home cuff reads 5 points higher systolic and 12 points higher diastolic.  ? ?Since this has been happening for the past week, will have her continue hydralazine 25 mg in the morning and afternoon as she is currently doing and increase to 50 mg (2 tablet) for the evening dose. Continue all other medications as prescribed. I will call her in a week to see if this has improved her BP. Instructed her to call in the meantime if she is still having problems. Gave patient strict ED precautions. Patient confirmed understanding of this plan.  ?

## 2022-03-22 ENCOUNTER — Telehealth: Payer: Self-pay | Admitting: Student-PharmD

## 2022-03-22 NOTE — Telephone Encounter (Signed)
Called patient to see how BP is doing since increasing evening hydralazine to 50 mg over the phone last week after report of elevated evening BP.  ? ?Unable to reach, LVM requesting call back.  ?

## 2022-03-29 NOTE — Telephone Encounter (Signed)
Called patient again - she reports her BP is doing much better since increasing her evening dose of hydralazine to 50 mg. Reports BP 130-140s/80s (this is her normal home readings - her cuff has been validated to read 5 points higher systolic and 12 points higher diastolic than clinic cuff). She reports sleeping much better and she feels this regimen is keeping her BP stable throughout the day. Will continue current medications. Instructed patient to reach back out if BP rises again or if she experiences any symptoms of low or high BP.  ?

## 2022-04-23 IMAGING — CT CT MAXILLOFACIAL W/O CM
3 series · 15 of 47 positions shown, 18 images · non-contrast
Comparison: None.

CLINICAL DATA: Fall from bed, facial bruising

EXAM:
CT HEAD WITHOUT CONTRAST
CT MAXILLOFACIAL WITHOUT CONTRAST
CT CERVICAL SPINE WITHOUT CONTRAST
TECHNIQUE: Multidetector CT imaging of the head, cervical spine, and
maxillofacial structures were performed using the standard protocol
without intravenous contrast. Multiplanar CT image reconstructions
of the cervical spine and maxillofacial structures were also
generated.

[Series 2: max soft · axial · 0.34mm/px · z∈[-263,-117]mm · 9 of 85 slices shown, 12 images]
[im 6/85  brain]
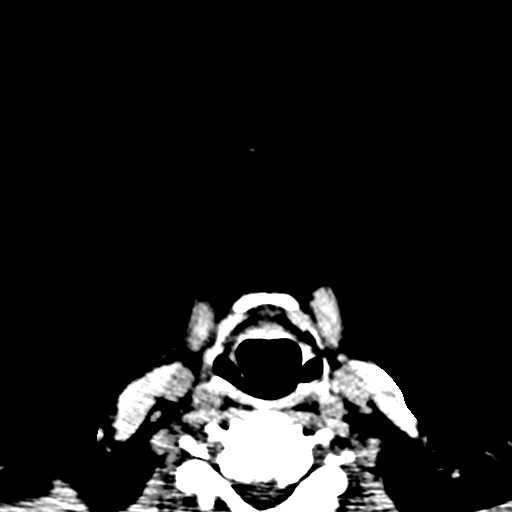
[im 6/85  bone]
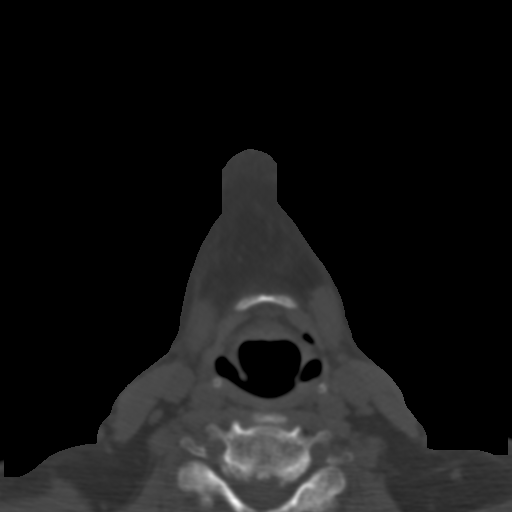
[im 15/85  bone]
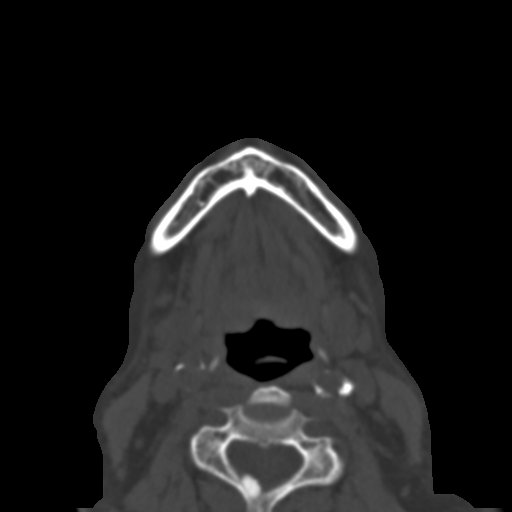
[im 24/85  bone]
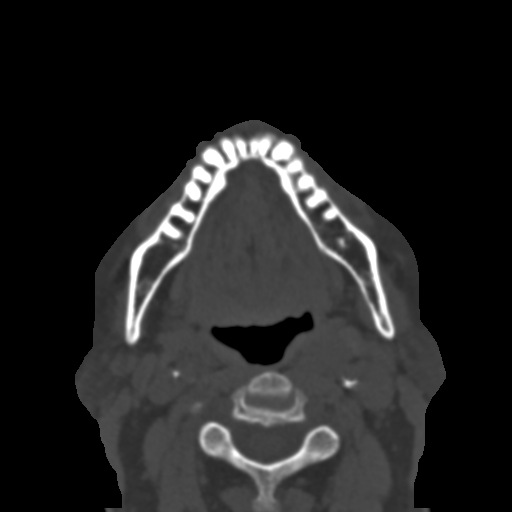
[im 32/85  bone]
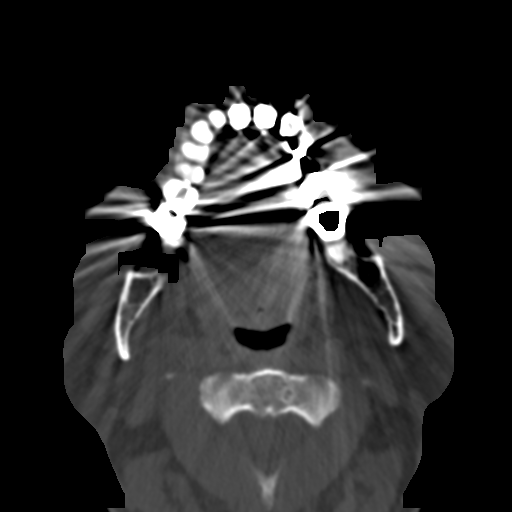
[im 44/85  brain]
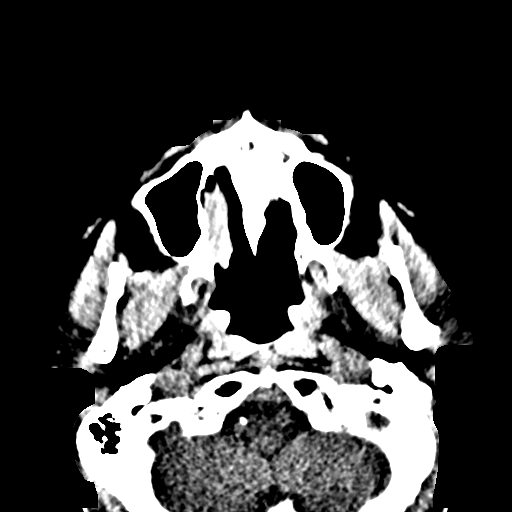
[im 44/85  bone]
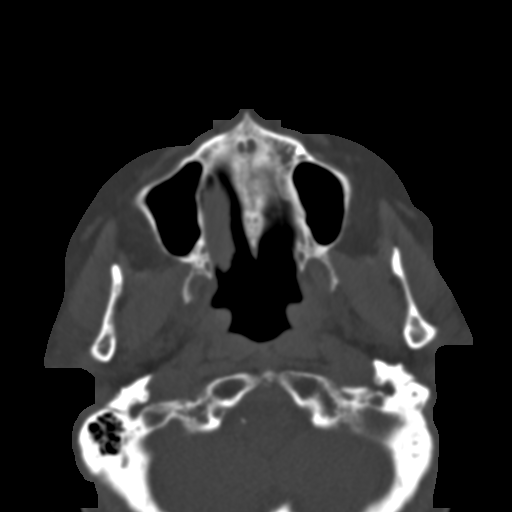
[im 53/85  bone]
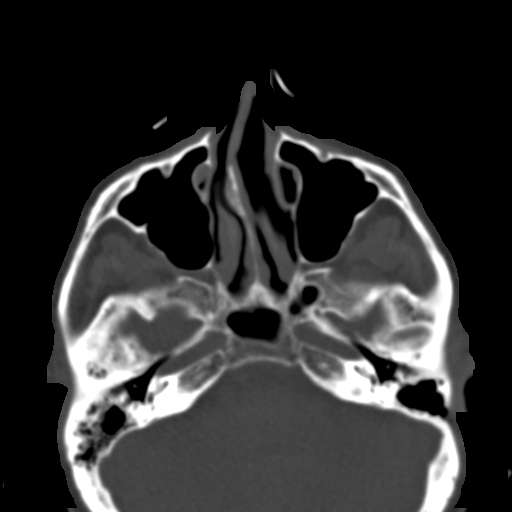
[im 61/85  bone]
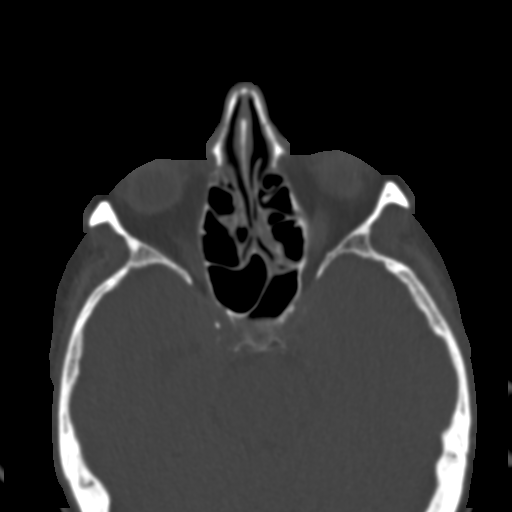
[im 70/85  bone]
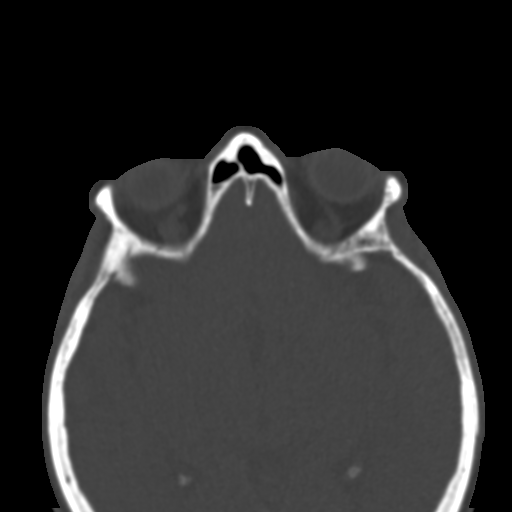
[im 79/85  brain]
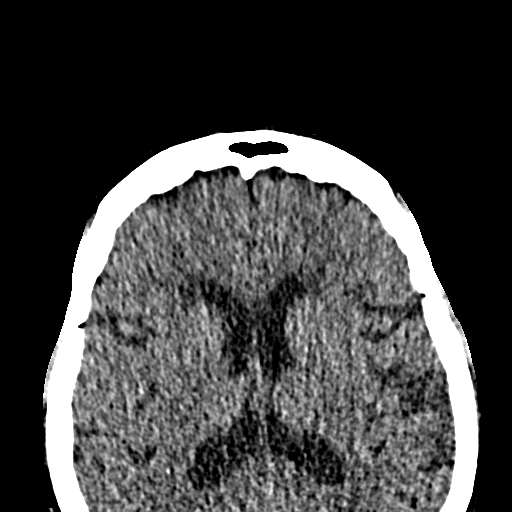
[im 79/85  bone]
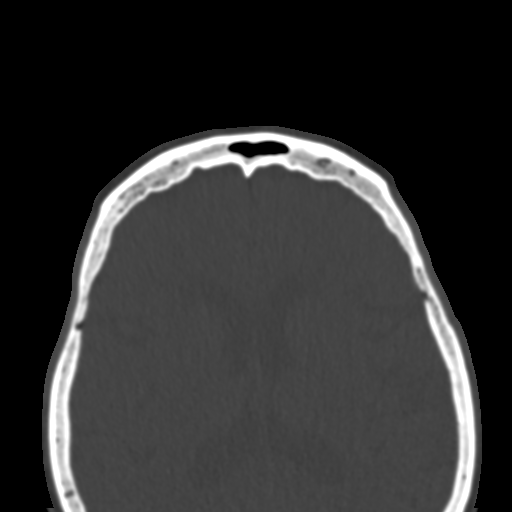

[Series 4: coronal soft · coronal · 0.34mm/px · 3 of 81 slices shown]
[im 27/81  bone]
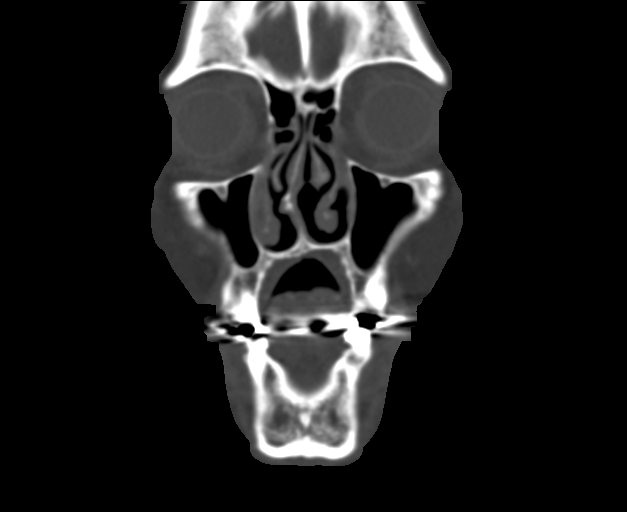
[im 36/81  bone]
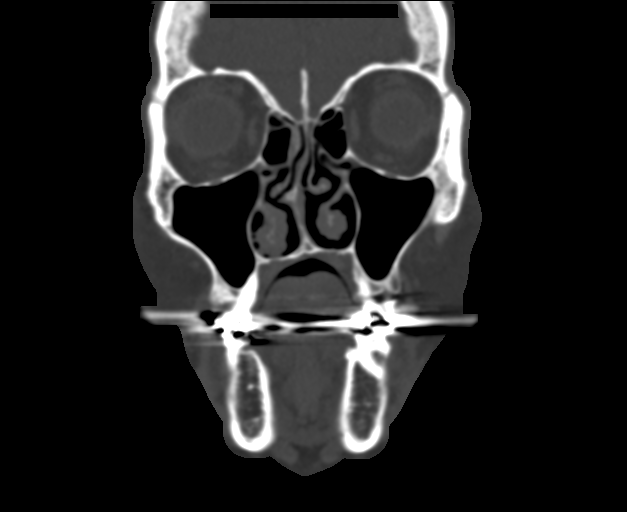
[im 45/81  bone]
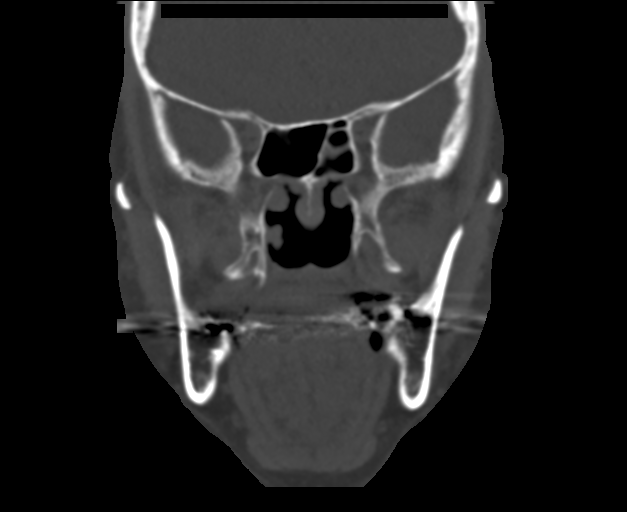

[Series 5: sagittal soft · sagittal · 0.34mm/px · 3 of 79 slices shown]
[im 27/79  bone]
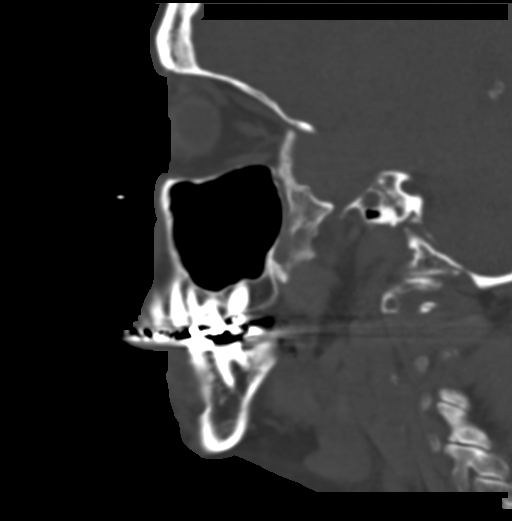
[im 40/79  bone]
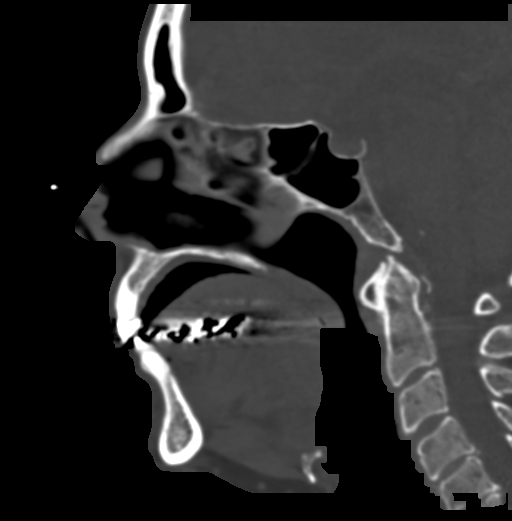
[im 53/79  bone]
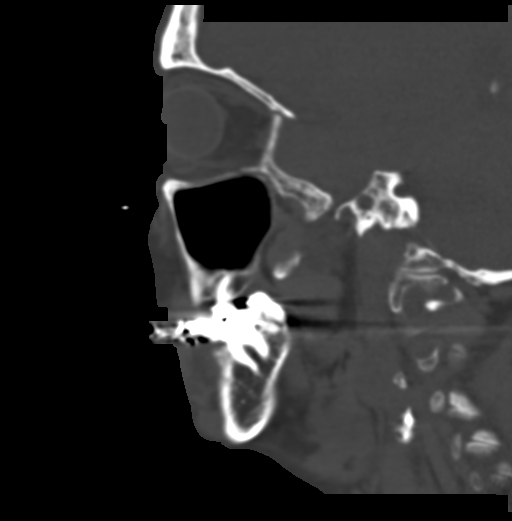

[15 of 47 positions shown; findings below may reference images not displayed]

FINDINGS: CT HEAD FINDINGS

Brain: No evidence of acute infarction, hemorrhage, hydrocephalus,
extra-axial collection or mass lesion/mass effect. Periventricular
and deep white matter hypodensity.

Vascular: No hyperdense vessel or unexpected calcification.

CT FACIAL BONES FINDINGS

Skull: Normal. Negative for fracture or focal lesion.

Facial bones: No displaced fractures or dislocations.

Sinuses/Orbits: No acute finding.

Other: None.

CT CERVICAL SPINE FINDINGS

Alignment: Normal.

Skull base and vertebrae: No acute fracture. No primary bone lesion
or focal pathologic process.

Soft tissues and spinal canal: No prevertebral fluid or swelling. No
visible canal hematoma.

Disc levels: Moderate multilevel disc space height loss and
osteophytosis.

Upper chest: Negative.

Other: None.
IMPRESSION: 1. No acute intracranial pathology. Small-vessel white matter
disease.
2. No displaced fracture or dislocation of the facial bones.
3. No fracture or static subluxation of the cervical spine.
4. Moderate multilevel cervical disc degenerative disease.

## 2022-04-25 IMAGING — US US RENAL
1 series · 15 of 25 positions shown · non-contrast
Comparison: CT May 06, 2017

CLINICAL DATA: BARLARO

EXAM:
RENAL / URINARY TRACT ULTRASOUND COMPLETE

[Series 1: us renal mc & wl · 15 of 33 slices shown]
[im 1/33]
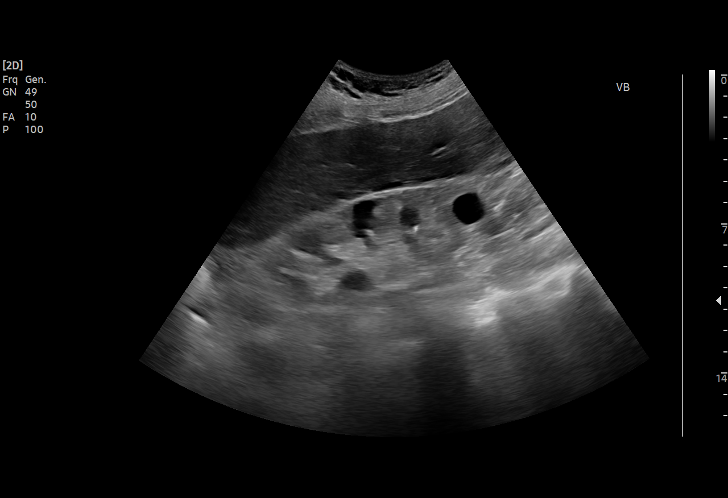
[im 3/33]
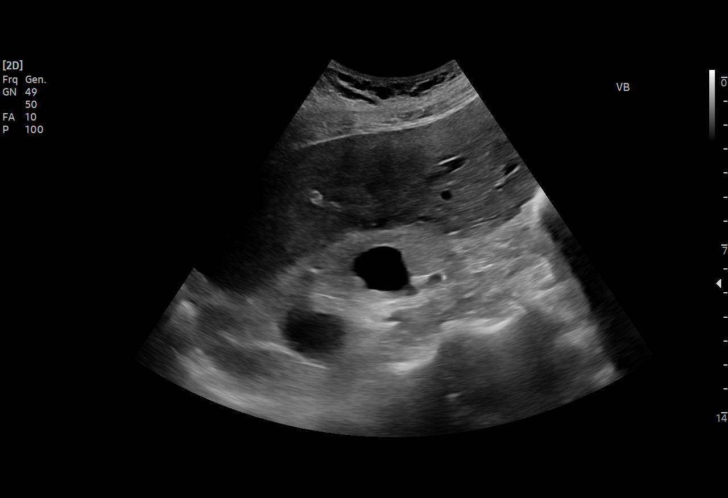
[im 6/33]
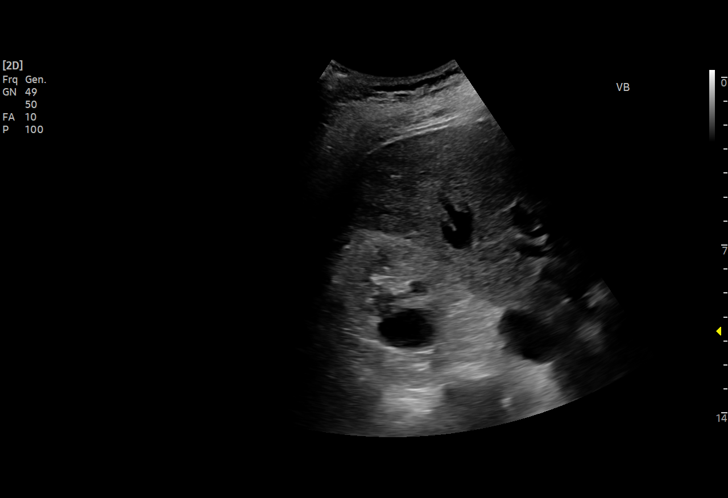
[im 7/33]
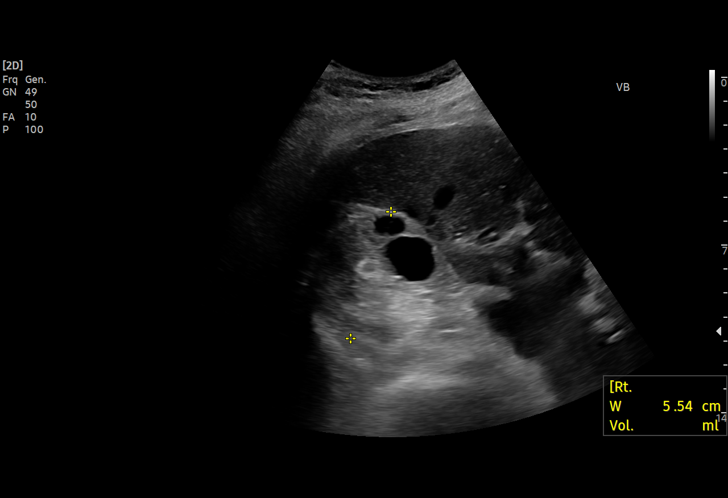
[im 10/33]
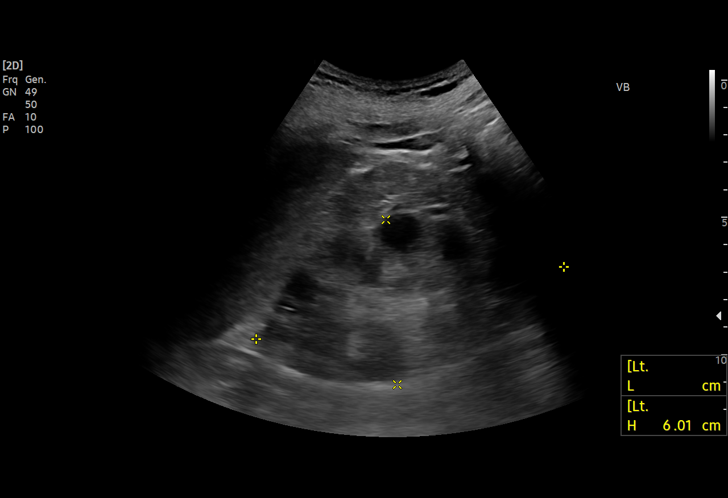
[im 13/33]
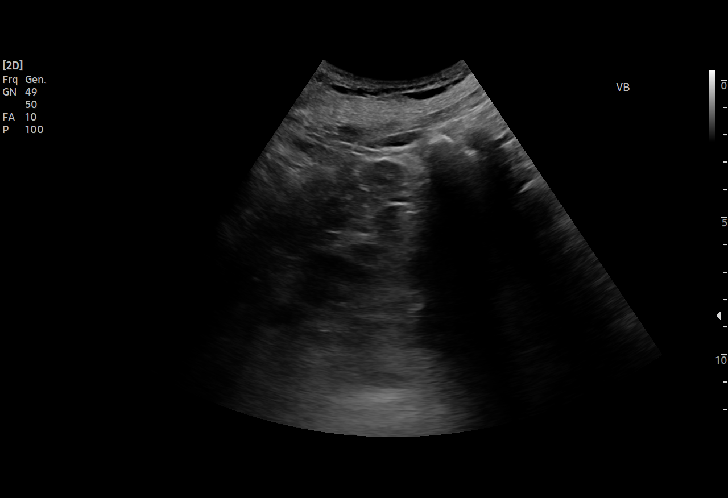
[im 14/33]
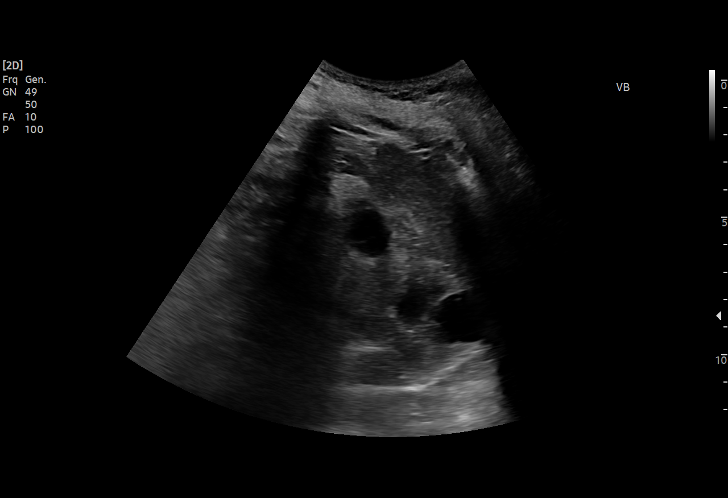
[im 17/33]
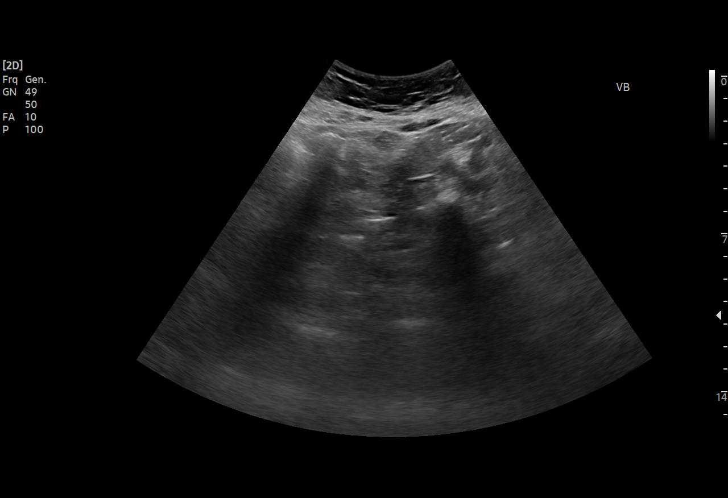
[im 19/33]
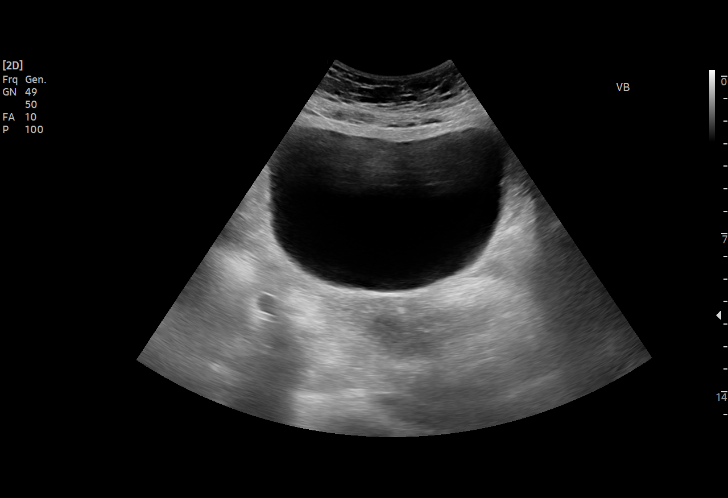
[im 21/33]
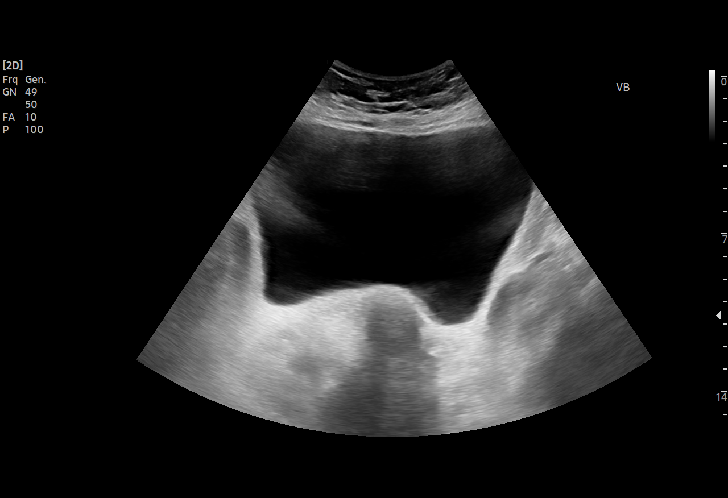
[im 23/33]
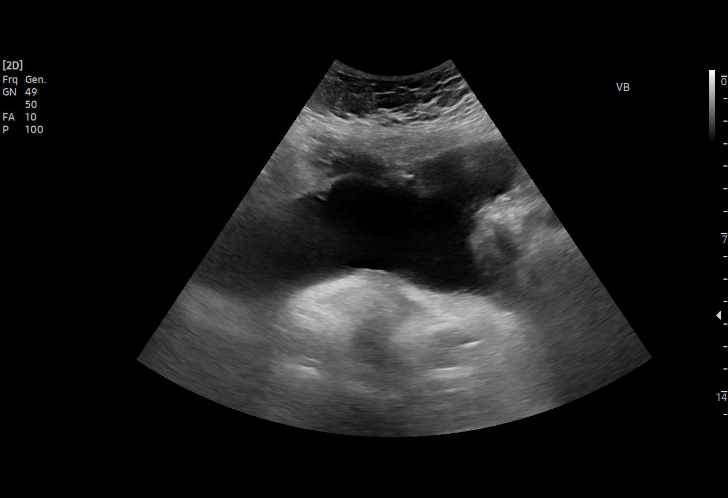
[im 26/33]
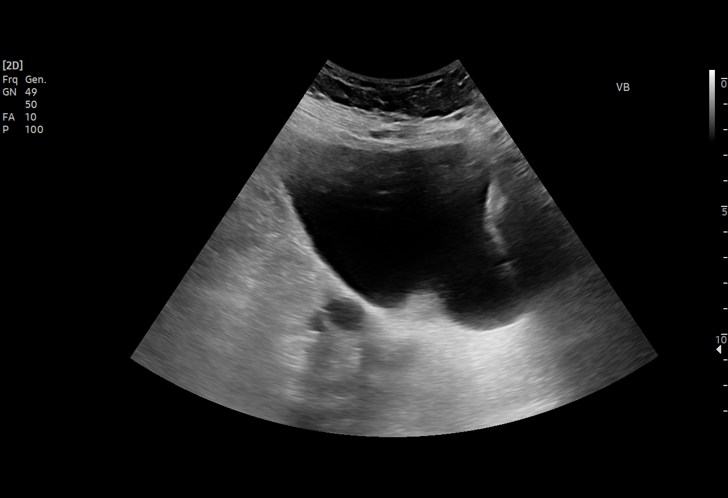
[im 27/33]
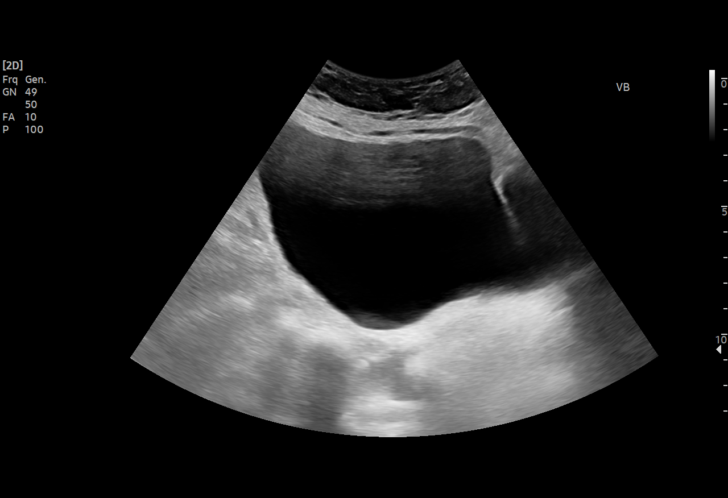
[im 30/33]
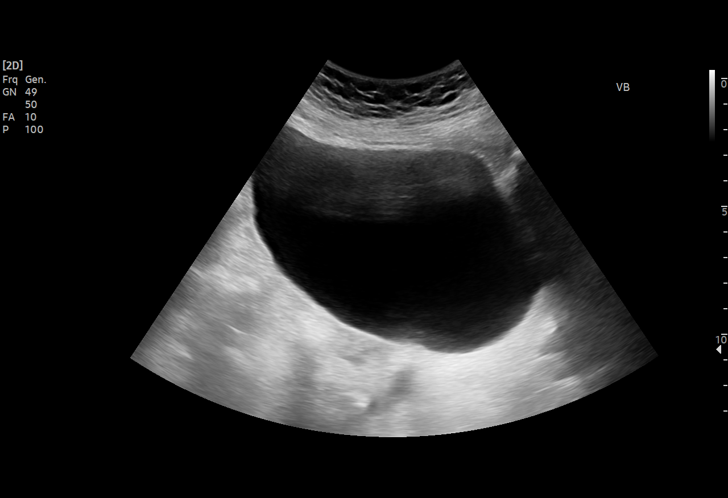
[im 33/33]
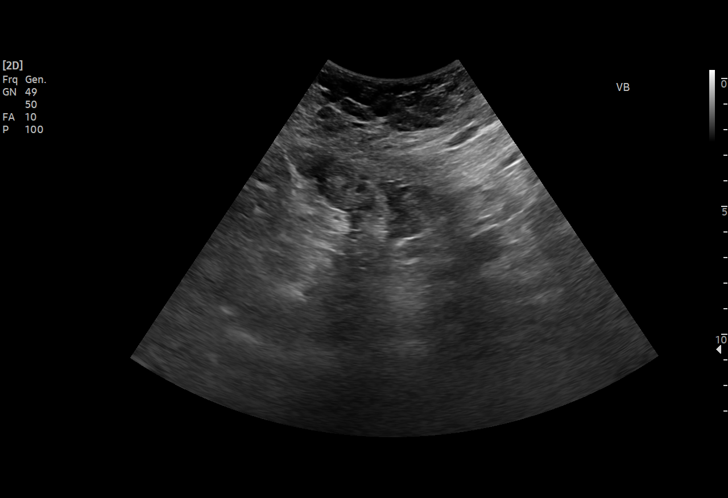

[15 of 25 positions shown; findings below may reference images not displayed]

FINDINGS: Right Kidney:

Renal measurements: 11.1 x 5.5 x 4.5 cm = volume: 196 mL.
Echogenicity within normal limits. No hydronephrosis. Multiple renal
cysts largest of which measures 3.7 cm.

Left Kidney:

Renal measurements: 11.5 x 6.3 x 6.0 cm = volume: 229 mL.
Echogenicity within normal limits. No hydronephrosis. Multiple renal
cysts largest of which measures 3 cm.

Bladder:

Appears normal for degree of bladder distention.

Other:

None.
IMPRESSION: No hydronephrosis.

## 2022-04-26 IMAGING — CR DG CHEST 2V
2 series · 2 of 2 positions shown · non-contrast
Comparison: October 20, 2021.

CLINICAL DATA: A 75-year-old female presents for evaluation of
cough, shortness of breath and weakness. Recent diagnosis of
influenza.

EXAM:
CHEST - 2 VIEW

[w chest lat]
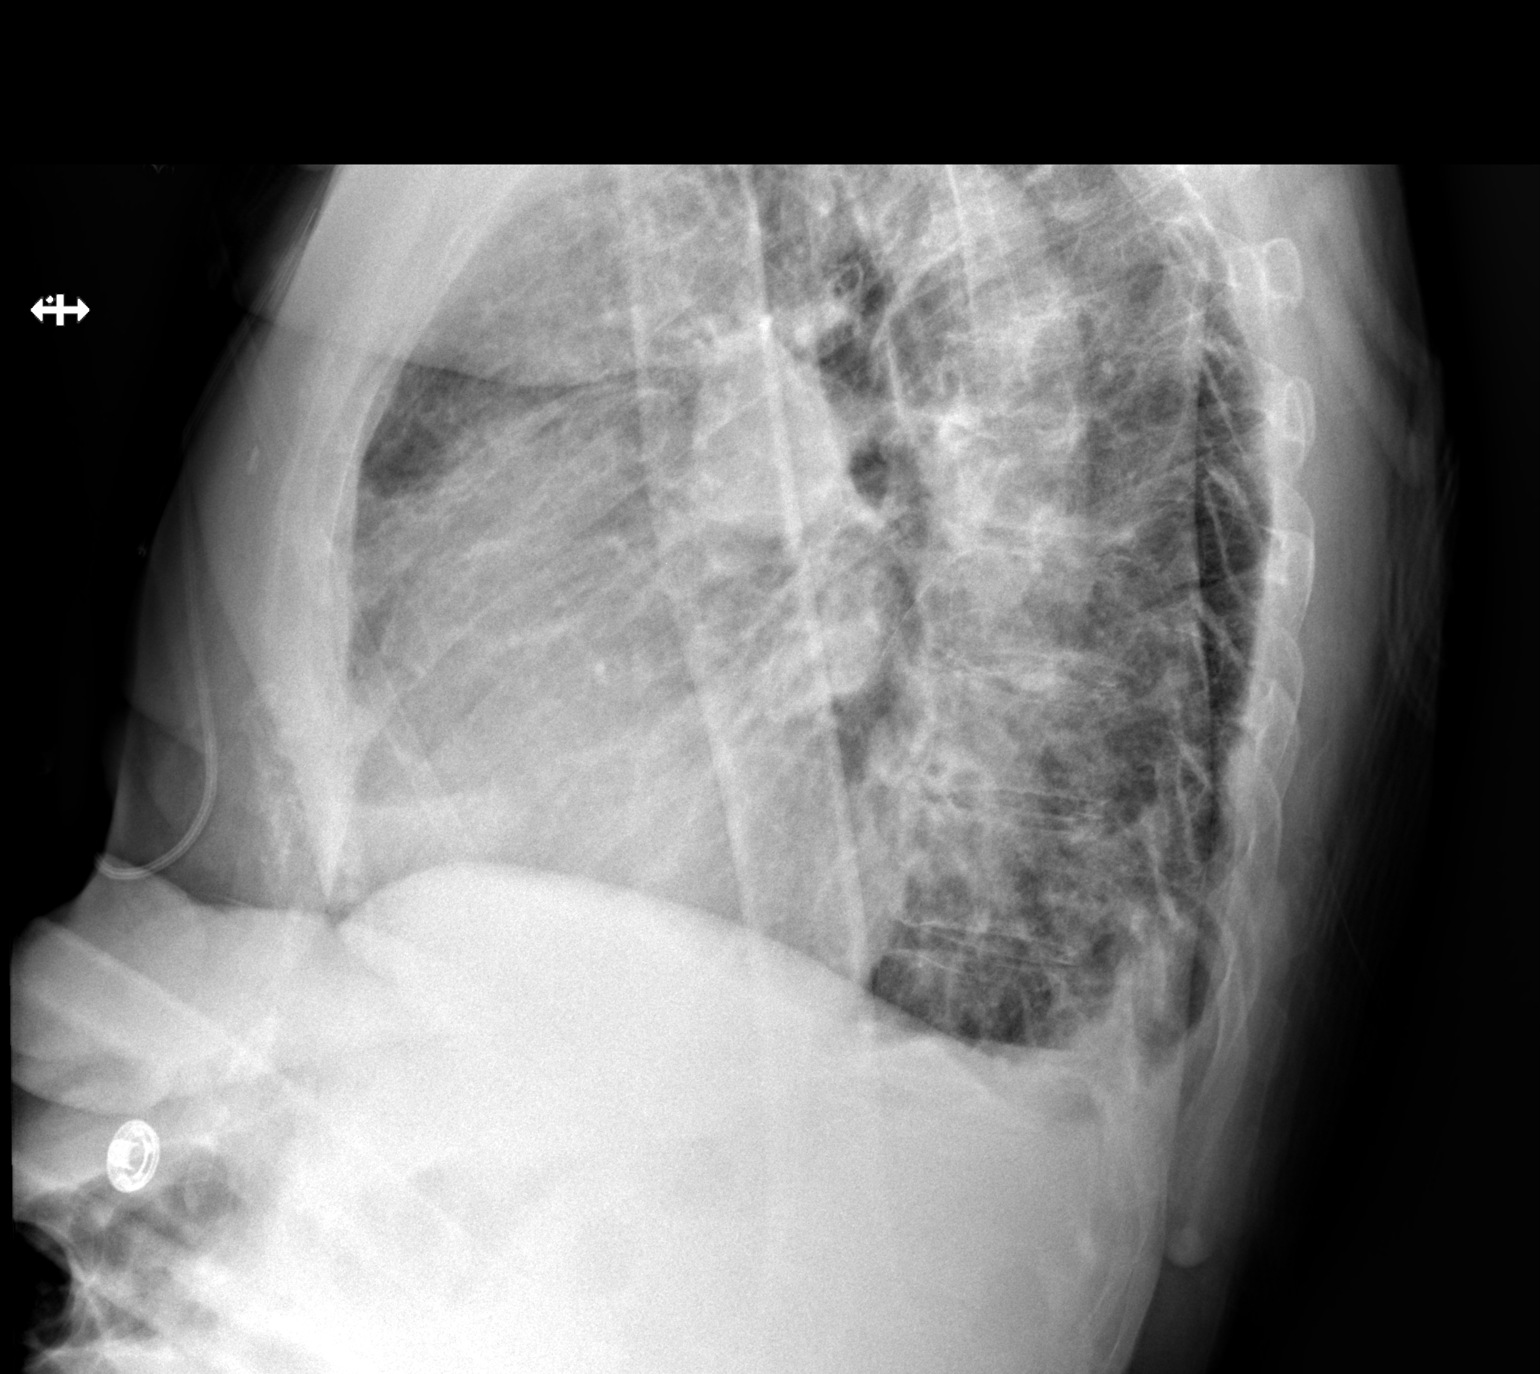

[x chest ap]
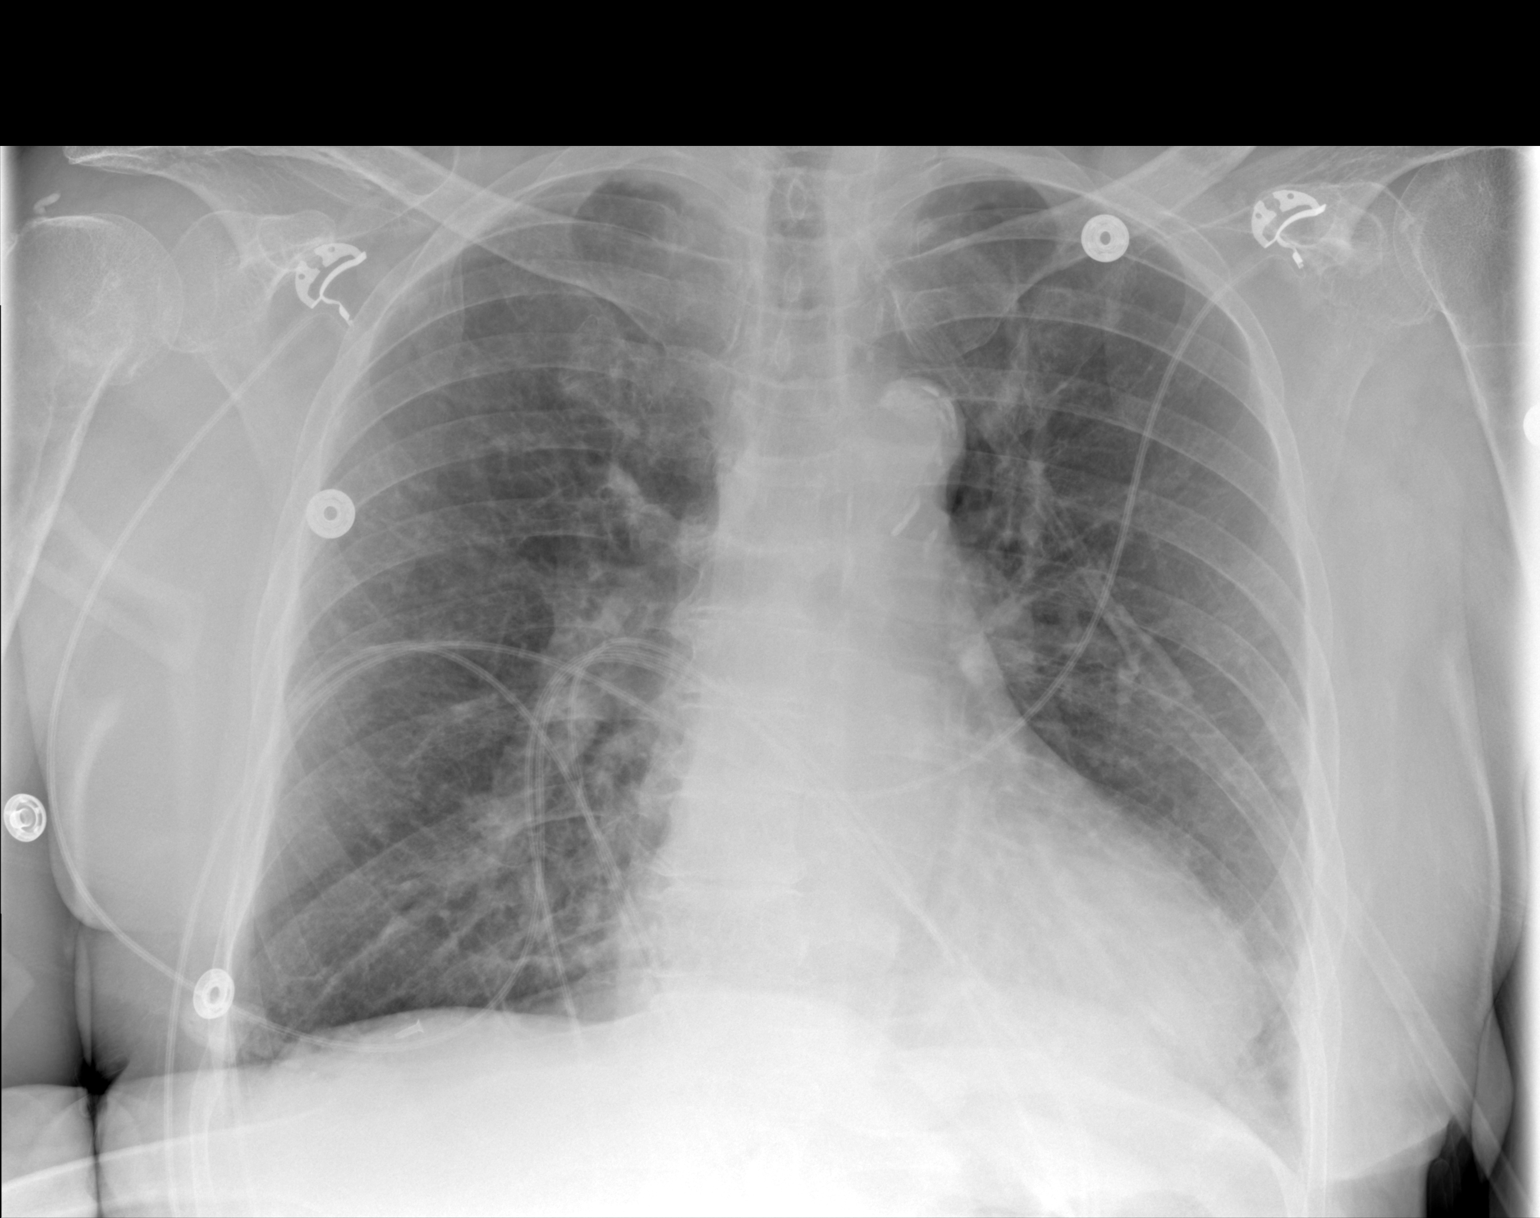

[2 of 2 positions shown; findings below may reference images not displayed]

FINDINGS: Trachea midline.

Cardiomediastinal contours with mild cardiac enlargement similar to
prior imaging. Aortic atherosclerosis.

Central pulmonary vascular congestion.

Patchy opacities have developed in the LEFT lung base. No lobar
level consolidative process. Mild interstitial prominence. Small
basilar effusions.

On limited assessment there is no acute skeletal process with signs
of RIGHT humeral fracture better seen on previous shoulder
radiographs.
IMPRESSION: Interstitial prominence and patchy LEFT basilar airspace disease
could reflect findings of viral or atypical infection.

Given development of small effusions and interstitial prominence in
this patient with cardiac enlargement would also consider the
possibility of concomitant heart failure or volume overload.

## 2022-06-02 ENCOUNTER — Other Ambulatory Visit: Payer: Self-pay | Admitting: Cardiology

## 2022-07-14 ENCOUNTER — Other Ambulatory Visit: Payer: Self-pay | Admitting: Cardiology

## 2022-08-04 ENCOUNTER — Other Ambulatory Visit: Payer: Self-pay | Admitting: Cardiology

## 2022-08-05 ENCOUNTER — Other Ambulatory Visit: Payer: Self-pay | Admitting: Cardiology

## 2022-08-13 ENCOUNTER — Other Ambulatory Visit: Payer: Self-pay | Admitting: Cardiology

## 2022-08-29 ENCOUNTER — Other Ambulatory Visit: Payer: Self-pay | Admitting: Cardiology

## 2023-05-30 ENCOUNTER — Other Ambulatory Visit: Payer: Self-pay | Admitting: Cardiology

## 2023-05-30 DIAGNOSIS — I10 Essential (primary) hypertension: Secondary | ICD-10-CM

## 2023-06-08 NOTE — Progress Notes (Signed)
  Electrophysiology Office Note:   Date:  06/09/2023  ID:  [name], DOB 1946-08-13, MRN [id number]  Primary Cardiologist: None Electrophysiologist: Lanier Prude, MD      History of Present Illness:   Veronica Jimenez is a 77 y.o. female with h/o , ESRD on PD, PAF and HTN seen today for routine electrophysiology followup.   Previously AF was managed on Sotalol -> Stopped with CKD Pt wore a zio which showed no AF, and monitor showed symptoms with NSR. Offered Loop recorder and pt declined.   Seen in [facility] 04/2023 for Anemia and possible TIA. Discharged on Eliquis, MRA did not show any large vessel occlusion.   Since last being seen in our clinic the patient has transitioned to HD via Peritoneal dialysis. She is now also on Eliquis. Currently, she denies chest pain, palpitations, dyspnea, PND, orthopnea, nausea, vomiting, dizziness, syncope, edema, weight gain, or early satiety. BP runs in 130-190s at home. She has recently had hydralazine and amlodipine adjusted.   Review of systems complete and found to be negative unless listed in HPI.   EP Information / Studies Reviewed:    EKG is ordered today. Personal review as below.  Personal review shows NSR at 93 bpm, QT 400/497s  Arrhythmia history H/o AF going back to 2004 Previously managed on Sotalol -> Stopped with CKD Not currently on OAC -> Recommended loop but pt declined.  Risk Assessment/Calculations:    HYPERTENSION CONTROL Vitals:   06/09/23 1144 06/09/23 1213  BP: (!) 178/82 (!) 148/66    The patient's blood pressure is elevated above target today.  In order to address the patient's elevated BP: A current anti-hypertensive medication was adjusted today.; Blood pressure will be monitored at home to determine if medication changes need to be made.           Physical Exam:   VS:  BP (!) 148/66   Pulse 93   Ht 5\' 3"  (1.6 m)   Wt 136 lb 3.2 oz (61.8 kg)   SpO2 97%   BMI 24.13 kg/m    Wt Readings from  Last 3 Encounters:  06/09/23 136 lb 3.2 oz (61.8 kg)  10/20/21 143 lb (64.9 kg)  04/13/21 142 lb (64.4 kg)     GEN: Well nourished, well developed in no acute distress NECK: No JVD; No carotid bruits CARDIAC: Regular rate and rhythm, no murmurs, rubs, gallops RESPIRATORY:  Clear to auscultation without rales, wheezing or rhonchi  ABDOMEN: Soft, non-tender, non-distended EXTREMITIES:  No edema; No deformity   ASSESSMENT AND PLAN:    Paroxysmal atrial fibrillation Now on CHA2DS2/VASc is at least 7.  Continue eliquis 5 mg BID If her dry weight settles down to 131 lbs or less, will need to dose reduce OR when she turns 79   HTN Increase Norvasc to 5mg  every day  She has Nephrology follow up next week and will otherwise let them manage.   ERSD via PD No AAD options other than amiodarone if she has more AF.   Follow up with Dr. Lalla Brothers in 12 months  Signed, Graciella Freer, PA-C

## 2023-06-09 ENCOUNTER — Ambulatory Visit: Payer: Medicare HMO | Admitting: Student

## 2023-06-09 ENCOUNTER — Other Ambulatory Visit: Payer: Self-pay

## 2023-06-09 ENCOUNTER — Encounter: Payer: Self-pay | Admitting: Student

## 2023-06-09 VITALS — BP 148/66 | HR 93 | Ht 63.0 in | Wt 136.2 lb

## 2023-06-09 DIAGNOSIS — I480 Paroxysmal atrial fibrillation: Secondary | ICD-10-CM | POA: Diagnosis not present

## 2023-06-09 DIAGNOSIS — I10 Essential (primary) hypertension: Secondary | ICD-10-CM

## 2023-06-09 MED ORDER — AMLODIPINE BESYLATE 5 MG PO TABS: 5.0000 mg | ORAL_TABLET | Freq: Every day | ORAL | 3 refills | Status: AC

## 2023-06-09 NOTE — Patient Instructions (Signed)
Medication Instructions:  1.Increase amlodipine to 5 mg daily *If you need a refill on your cardiac medications before your next appointment, please call your pharmacy*  Lab Work: None ordered If you have labs (blood work) drawn today and your tests are completely normal, you will receive your results only by: MyChart Message (if you have MyChart) OR A paper copy in the mail If you have any lab test that is abnormal or we need to change your treatment, we will call you to review the results.  Follow-Up: At [facility], you and your health needs are our priority.  As part of our continuing mission to provide you with exceptional heart care, we have created designated Provider Care Teams.  These Care Teams include your primary Cardiologist (physician) and Advanced Practice Providers (APPs -  Physician Assistants and Nurse Practitioners) who all work together to provide you with the care you need, when you need it.  Your next appointment:   1 year(s)  Provider:   Steffanie Dunn, MD or Doreatha Martin, PA-C

## 2024-10-27 NOTE — Progress Notes (Deleted)
  Electrophysiology Office Follow up Visit Note:    Date:  10/27/2024   ID:  Veronica Jimenez, DOB [birth year], MRN [id number]  PCP:  Gretta Crank, MD  [facility] Cardiologist:  None  [facility] Electrophysiologist:  OLE ONEIDA HOLTS, MD    Interval History:     Veronica Jimenez is a 78 y.o. female who presents for a follow up visit.   The patient last saw Veronica Jimenez June 09, 2023.  The patient has a history of end-stage renal disease on peritoneal dialysis, atrial fibrillation and hypertension.  The patient was on Eliquis for stroke prophylaxis.        Past medical, surgical, social and family history were reviewed.  ROS:   Please see the history of present illness.    All other systems reviewed and are negative.  EKGs/Labs/Other Studies Reviewed:    The following studies were reviewed today:          Physical Exam:    VS:  There were no vitals taken for this visit.    Wt Readings from Last 3 Encounters:  06/09/23 136 lb 3.2 oz (61.8 kg)  10/20/21 143 lb (64.9 kg)  04/13/21 142 lb (64.4 kg)     GEN: no distress CARD: RRR, No MRG RESP: No IWOB. CTAB.      ASSESSMENT:    No diagnosis found. PLAN:    In order of problems listed above:  #Atrial fibrillation On Eliquis for stroke prophylaxis  #Hypertension *** goal today.  Recommend checking blood pressures 1-2 times per week at home and recording the values.  Recommend bringing these recordings to the primary care physician.  I discussed my upcoming departure from [facility] during today's clinic appointment.  The patient will continue to follow-up with one of my EP partners moving forward.  Follow-up with EP APP in 1 year   Signed, Ole Holts, MD, [facility], [facility] 10/27/2024 12:17 PM    Electrophysiology [facility]

## 2024-10-28 ENCOUNTER — Encounter: Payer: Self-pay | Admitting: Cardiology

## 2024-10-28 ENCOUNTER — Ambulatory Visit: Attending: Cardiology | Admitting: Cardiology

## 2024-10-28 ENCOUNTER — Ambulatory Visit: Admitting: Cardiology

## 2024-10-28 VITALS — BP 130/66 | HR 90 | Ht 63.0 in | Wt 118.0 lb

## 2024-10-28 DIAGNOSIS — I10 Essential (primary) hypertension: Secondary | ICD-10-CM

## 2024-10-28 DIAGNOSIS — I480 Paroxysmal atrial fibrillation: Secondary | ICD-10-CM | POA: Diagnosis not present

## 2024-10-28 NOTE — Patient Instructions (Signed)

## 2024-10-28 NOTE — Progress Notes (Signed)
  Electrophysiology Office Follow up Visit Note:    Date:  10/28/2024   ID:  Veronica Jimenez, DOB 1946/09/13, MRN [id number]  PCP:  Gretta Crank, MD  [facility] Cardiologist:  None  [facility] Electrophysiologist:  OLE ONEIDA HOLTS, MD    Interval History:     Veronica Jimenez is a 78 y.o. female who presents for a follow up visit.   The patient last saw [facility] June 09, 2023.  The patient has a history of end-stage renal disease on peritoneal dialysis, atrial fibrillation and hypertension.  The patient was on Eliquis for stroke prophylaxis.  She is slowly improving after a prolonged course of pneumonia recently.  She has coughed so much she has some rib pain.  She also think she fractured her rib during the illness.  She confirms today that she is not taking Eliquis and has never taken this medication.  She only takes aspirin  81 mg by mouth once daily.  She gets most of her care at [facility] and at [facility].  No recent problems with her heart rhythm.      Past medical, surgical, social and family history were reviewed.  ROS:   Please see the history of present illness.    All other systems reviewed and are negative.  EKGs/Labs/Other Studies Reviewed:    The following studies were reviewed today:          Physical Exam:    VS:  BP 130/66 (BP Location: Left Arm, Patient Position: Sitting, Cuff Size: Normal)   Pulse 90   Ht 5' 3 (1.6 m)   Wt 118 lb (53.5 kg)   SpO2 97%   BMI 20.90 kg/m     Wt Readings from Last 3 Encounters:  10/28/24 118 lb (53.5 kg)  06/09/23 136 lb 3.2 oz (61.8 kg)  10/20/21 143 lb (64.9 kg)     GEN: no distress CARD: RRR, No MRG RESP: No IWOB. CTAB.      ASSESSMENT:    1. AF (paroxysmal atrial fibrillation) (HCC)   2. Essential hypertension    PLAN:    In order of problems listed above:  #Atrial fibrillation No recent episodes On aspirin  for stroke prophylaxis. The patient has not taken systemic anticoagulation given  problems with anemia associated with her renal disease.  #Hypertension At goal today.  Recommend checking blood pressures 1-2 times per week at home and recording the values.  Recommend bringing these recordings to the primary care physician.  I discussed my upcoming departure from [facility] during today's clinic appointment.  The patient will continue to follow-up with one of my EP partners moving forward.  Follow-up with EP APP in 1 year   Signed, Ole Holts, MD, [facility], [facility] 10/28/2024 3:13 PM    Electrophysiology [facility]
